# Patient Record
Sex: Female | Born: 1985
Health system: Southern US, Community
[De-identification: ages and names within clinical notes are randomized; demographics above are authoritative.]

## PROBLEM LIST (undated history)

## (undated) ENCOUNTER — Inpatient Hospital Stay (HOSPITAL_COMMUNITY): Payer: Self-pay

## (undated) ENCOUNTER — Ambulatory Visit (HOSPITAL_COMMUNITY): Discharge status: Absent without leave | Payer: No Typology Code available for payment source

## (undated) DIAGNOSIS — F419 Anxiety disorder, unspecified: Secondary | ICD-10-CM

## (undated) DIAGNOSIS — D649 Anemia, unspecified: Secondary | ICD-10-CM

## (undated) DIAGNOSIS — A549 Gonococcal infection, unspecified: Secondary | ICD-10-CM

## (undated) DIAGNOSIS — F32A Depression, unspecified: Secondary | ICD-10-CM

## (undated) DIAGNOSIS — F329 Major depressive disorder, single episode, unspecified: Secondary | ICD-10-CM

## (undated) DIAGNOSIS — J40 Bronchitis, not specified as acute or chronic: Secondary | ICD-10-CM

## (undated) DIAGNOSIS — Z87440 Personal history of urinary (tract) infections: Secondary | ICD-10-CM

## (undated) DIAGNOSIS — E669 Obesity, unspecified: Secondary | ICD-10-CM

## (undated) DIAGNOSIS — W3400XA Accidental discharge from unspecified firearms or gun, initial encounter: Secondary | ICD-10-CM

## (undated) HISTORY — PX: BLADDER REPAIR: SHX76

---
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
## 2007-08-18 ENCOUNTER — Emergency Department (HOSPITAL_COMMUNITY): Admission: EM | Admit: 2007-08-18 | Discharge: 2007-08-18 | Payer: Self-pay | Admitting: Emergency Medicine

## 2007-09-19 ENCOUNTER — Emergency Department (HOSPITAL_COMMUNITY): Admission: EM | Admit: 2007-09-19 | Discharge: 2007-09-20 | Payer: Self-pay | Admitting: Emergency Medicine

## 2007-10-28 ENCOUNTER — Emergency Department (HOSPITAL_COMMUNITY): Admission: EM | Admit: 2007-10-28 | Discharge: 2007-10-28 | Payer: Self-pay | Admitting: Emergency Medicine

## 2008-02-19 DIAGNOSIS — Y249XXA Unspecified firearm discharge, undetermined intent, initial encounter: Secondary | ICD-10-CM

## 2008-02-19 DIAGNOSIS — W3400XA Accidental discharge from unspecified firearms or gun, initial encounter: Secondary | ICD-10-CM

## 2008-02-19 HISTORY — PX: BLADDER REPAIR: SHX76

## 2008-02-19 HISTORY — DX: Unspecified firearm discharge, undetermined intent, initial encounter: Y24.9XXA

## 2008-02-19 HISTORY — DX: Accidental discharge from unspecified firearms or gun, initial encounter: W34.00XA

## 2009-03-27 ENCOUNTER — Ambulatory Visit: Payer: Self-pay | Admitting: Physician Assistant

## 2009-03-27 ENCOUNTER — Inpatient Hospital Stay (HOSPITAL_COMMUNITY): Admission: AD | Admit: 2009-03-27 | Discharge: 2009-03-27 | Payer: Self-pay | Admitting: Obstetrics and Gynecology

## 2009-11-28 ENCOUNTER — Emergency Department (HOSPITAL_COMMUNITY): Admission: EM | Admit: 2009-11-28 | Discharge: 2009-11-28 | Payer: Self-pay | Admitting: Emergency Medicine

## 2009-12-31 ENCOUNTER — Emergency Department (HOSPITAL_COMMUNITY): Admission: EM | Admit: 2009-12-31 | Discharge: 2009-12-31 | Payer: Self-pay | Admitting: Family Medicine

## 2010-05-10 LAB — CBC
HCT: 37.5 % (ref 36.0–46.0)
Hemoglobin: 12.6 g/dL (ref 12.0–15.0)
MCHC: 33.5 g/dL (ref 30.0–36.0)
RDW: 12.8 % (ref 11.5–15.5)

## 2010-05-10 LAB — URINALYSIS, ROUTINE W REFLEX MICROSCOPIC
Ketones, ur: NEGATIVE mg/dL
Nitrite: NEGATIVE
Protein, ur: NEGATIVE mg/dL
Specific Gravity, Urine: 1.01 (ref 1.005–1.030)
pH: 7 (ref 5.0–8.0)

## 2010-05-10 LAB — WET PREP, GENITAL: Yeast Wet Prep HPF POC: NONE SEEN

## 2010-05-10 LAB — URINE MICROSCOPIC-ADD ON

## 2010-05-10 LAB — URINE CULTURE

## 2010-05-10 LAB — POCT PREGNANCY, URINE: Preg Test, Ur: POSITIVE

## 2010-06-22 ENCOUNTER — Inpatient Hospital Stay (HOSPITAL_COMMUNITY)
Admission: AD | Admit: 2010-06-22 | Discharge: 2010-06-22 | Disposition: A | Discharge status: Home / Self Care | Payer: Self-pay | Source: Ambulatory Visit | Attending: Obstetrics and Gynecology | Admitting: Obstetrics and Gynecology

## 2010-06-22 DIAGNOSIS — N949 Unspecified condition associated with female genital organs and menstrual cycle: Secondary | ICD-10-CM

## 2010-06-22 DIAGNOSIS — R109 Unspecified abdominal pain: Secondary | ICD-10-CM

## 2010-06-22 DIAGNOSIS — N760 Acute vaginitis: Secondary | ICD-10-CM

## 2010-06-22 DIAGNOSIS — A499 Bacterial infection, unspecified: Secondary | ICD-10-CM | POA: Insufficient documentation

## 2010-06-22 DIAGNOSIS — N938 Other specified abnormal uterine and vaginal bleeding: Secondary | ICD-10-CM

## 2010-06-22 DIAGNOSIS — B9689 Other specified bacterial agents as the cause of diseases classified elsewhere: Secondary | ICD-10-CM | POA: Insufficient documentation

## 2010-06-22 LAB — DIFFERENTIAL
Basophils Relative: 1 % (ref 0–1)
Lymphs Abs: 2 10*3/uL (ref 0.7–4.0)
Monocytes Relative: 7 % (ref 3–12)
Neutro Abs: 3.5 10*3/uL (ref 1.7–7.7)

## 2010-06-22 LAB — CBC
HCT: 38.5 % (ref 36.0–46.0)
Hemoglobin: 13.1 g/dL (ref 12.0–15.0)
RBC: 4.32 MIL/uL (ref 3.87–5.11)
RDW: 12.1 % (ref 11.5–15.5)
WBC: 6.1 10*3/uL (ref 4.0–10.5)

## 2010-06-22 LAB — URINALYSIS, ROUTINE W REFLEX MICROSCOPIC
Bilirubin Urine: NEGATIVE
Glucose, UA: NEGATIVE mg/dL
Ketones, ur: NEGATIVE mg/dL
Nitrite: NEGATIVE
Protein, ur: NEGATIVE mg/dL
Urobilinogen, UA: 0.2 mg/dL (ref 0.0–1.0)

## 2010-06-22 LAB — WET PREP, GENITAL

## 2010-06-23 LAB — GC/CHLAMYDIA PROBE AMP, GENITAL: Chlamydia, DNA Probe: NEGATIVE

## 2010-11-15 LAB — URINALYSIS, ROUTINE W REFLEX MICROSCOPIC
Glucose, UA: NEGATIVE
Hgb urine dipstick: NEGATIVE
Protein, ur: NEGATIVE
Specific Gravity, Urine: 1.035 — ABNORMAL HIGH
pH: 6

## 2010-11-15 LAB — RPR: RPR Ser Ql: NONREACTIVE

## 2010-11-15 LAB — WET PREP, GENITAL
Trich, Wet Prep: NONE SEEN
Yeast Wet Prep HPF POC: NONE SEEN

## 2010-11-15 LAB — POCT PREGNANCY, URINE
Operator id: 22937
Preg Test, Ur: NEGATIVE

## 2010-11-16 LAB — URINALYSIS, ROUTINE W REFLEX MICROSCOPIC
Bilirubin Urine: NEGATIVE
Glucose, UA: NEGATIVE
Specific Gravity, Urine: 1.021
Urobilinogen, UA: 1
pH: 5.5

## 2010-11-16 LAB — URINE MICROSCOPIC-ADD ON

## 2010-11-21 LAB — URINALYSIS, ROUTINE W REFLEX MICROSCOPIC
Glucose, UA: NEGATIVE
Specific Gravity, Urine: 1.024

## 2010-11-21 LAB — URINE MICROSCOPIC-ADD ON

## 2010-11-21 LAB — POCT PREGNANCY, URINE: Preg Test, Ur: NEGATIVE

## 2010-12-31 ENCOUNTER — Inpatient Hospital Stay (HOSPITAL_COMMUNITY): Payer: Self-pay

## 2010-12-31 ENCOUNTER — Inpatient Hospital Stay (HOSPITAL_COMMUNITY)
Admission: AD | Admit: 2010-12-31 | Discharge: 2010-12-31 | Disposition: A | Discharge status: Home / Self Care | Payer: Self-pay | Source: Ambulatory Visit | Attending: Obstetrics & Gynecology | Admitting: Obstetrics & Gynecology

## 2010-12-31 ENCOUNTER — Encounter (HOSPITAL_COMMUNITY): Payer: Self-pay | Admitting: *Deleted

## 2010-12-31 DIAGNOSIS — O209 Hemorrhage in early pregnancy, unspecified: Secondary | ICD-10-CM | POA: Insufficient documentation

## 2010-12-31 DIAGNOSIS — R1032 Left lower quadrant pain: Secondary | ICD-10-CM | POA: Insufficient documentation

## 2010-12-31 HISTORY — DX: Accidental discharge from unspecified firearms or gun, initial encounter: W34.00XA

## 2010-12-31 HISTORY — DX: Gonococcal infection, unspecified: A54.9

## 2010-12-31 LAB — WET PREP, GENITAL: Yeast Wet Prep HPF POC: NONE SEEN

## 2010-12-31 LAB — URINALYSIS, ROUTINE W REFLEX MICROSCOPIC
Glucose, UA: NEGATIVE mg/dL
Leukocytes, UA: NEGATIVE
pH: 6 (ref 5.0–8.0)

## 2010-12-31 LAB — CBC
HCT: 39.1 % (ref 36.0–46.0)
Hemoglobin: 13.7 g/dL (ref 12.0–15.0)
MCHC: 35 g/dL (ref 30.0–36.0)
MCV: 88.5 fL (ref 78.0–100.0)
RDW: 12.3 % (ref 11.5–15.5)
WBC: 7.5 10*3/uL (ref 4.0–10.5)

## 2010-12-31 NOTE — ED Provider Notes (Signed)
History   Pt presents today c/o vag spotting "on and off." She states she has noticed 2-3 episodes of spotting over the past week. She states she has not seen anymore since Saturday. Her last episode of intercourse was about 1 month ago. She denies vag irritation but does report a vag dc. She states she was recently given a Rx for Flagyl but she did not get the medication filled because she does not like to take pills. She also c/o occ LLQ pain. She denies fever or any other sx at this time.  Chief Complaint  Patient presents with  . Vaginal Bleeding   HPI  OB History    Grav Para Term Preterm Abortions TAB SAB Ect Mult Living                  No past medical history on file.  No past surgical history on file.  No family history on file.  History  Substance Use Topics  . Smoking status: Not on file  . Smokeless tobacco: Not on file  . Alcohol Use: Not on file    Allergies:  Allergies  Allergen Reactions  . Amoxicillin Rash    Prescriptions prior to admission  Medication Sig Dispense Refill  . diphenhydramine-acetaminophen (TYLENOL PM) 25-500 MG TABS Take 1 tablet by mouth at bedtime as needed. Patient took this medication for pain and sleep.         Review of Systems  Constitutional: Negative for fever.  Cardiovascular: Negative for chest pain.  Gastrointestinal: Positive for abdominal pain. Negative for nausea, vomiting, diarrhea and constipation.  Genitourinary: Negative for dysuria, urgency, frequency and hematuria.  Neurological: Negative for dizziness and headaches.  Psychiatric/Behavioral: Negative for depression and suicidal ideas.   Physical Exam   Blood pressure 109/72, pulse 104, temperature 98.8 F (37.1 C), resp. rate 18, height 5\' 4"  (1.626 m), weight 160 lb (72.576 kg), last menstrual period 11/27/2010.  Physical Exam  Nursing note and vitals reviewed. Constitutional: She is oriented to person, place, and time. She appears well-developed and  well-nourished. No distress.  HENT:  Head: Normocephalic and atraumatic.  Eyes: EOM are normal. Pupils are equal, round, and reactive to light.  GI: Soft. She exhibits no distension. There is no tenderness. There is no rebound and no guarding.  Genitourinary: No bleeding around the vagina. Vaginal discharge found.       Creamy, white vag dc present. No vag bleeding noted. Uterus NL size and shape. No adnexal masses. Pt nontender on exam.  Neurological: She is alert and oriented to person, place, and time.  Skin: Skin is warm and dry. She is not diaphoretic.  Psychiatric: She has a normal mood and affect. Her behavior is normal. Judgment and thought content normal.    MAU Course  Procedures  Wet prep and GC/Chlamydia cultures done.  Results for orders placed during the hospital encounter of 12/31/10 (from the past 24 hour(s))  URINALYSIS, ROUTINE W REFLEX MICROSCOPIC     Status: Abnormal   Collection Time   12/31/10  2:45 PM      Component Value Range   Color, Urine YELLOW  YELLOW    Appearance HAZY (*) CLEAR    Specific Gravity, Urine 1.015  1.005 - 1.030    pH 6.0  5.0 - 8.0    Glucose, UA NEGATIVE  NEGATIVE (mg/dL)   Hgb urine dipstick NEGATIVE  NEGATIVE    Bilirubin Urine NEGATIVE  NEGATIVE    Ketones, ur NEGATIVE  NEGATIVE (  mg/dL)   Protein, ur NEGATIVE  NEGATIVE (mg/dL)   Urobilinogen, UA 1.0  0.0 - 1.0 (mg/dL)   Nitrite NEGATIVE  NEGATIVE    Leukocytes, UA NEGATIVE  NEGATIVE   POCT PREGNANCY, URINE     Status: Normal   Collection Time   12/31/10  2:57 PM      Component Value Range   Preg Test, Ur POSITIVE    WET PREP, GENITAL     Status: Abnormal   Collection Time   12/31/10  3:44 PM      Component Value Range   Yeast, Wet Prep NONE SEEN  NONE SEEN    Trich, Wet Prep NONE SEEN  NONE SEEN    Clue Cells, Wet Prep MANY (*) NONE SEEN    WBC, Wet Prep HPF POC FEW (*) NONE SEEN   CBC     Status: Normal   Collection Time   12/31/10  4:08 PM      Component Value Range     WBC 7.5  4.0 - 10.5 (K/uL)   RBC 4.42  3.87 - 5.11 (MIL/uL)   Hemoglobin 13.7  12.0 - 15.0 (g/dL)   HCT 16.1  09.6 - 04.5 (%)   MCV 88.5  78.0 - 100.0 (fL)   MCH 31.0  26.0 - 34.0 (pg)   MCHC 35.0  30.0 - 36.0 (g/dL)   RDW 40.9  81.1 - 91.4 (%)   Platelets 254  150 - 400 (K/uL)  HCG, QUANTITATIVE, PREGNANCY     Status: Abnormal   Collection Time   12/31/10  4:08 PM      Component Value Range   hCG, Beta Chain, Quant, S 562 (*) <5 (mIU/mL)   US shows no IUP or adnexal masses. Assessment and Plan  Bleeding in preg: discussed with pt at length. She will f/u in 2 days for repeat B-quant. Discussed diet, activity, risks, and precautions. Discussed signs and sx of ectopic preg.  Clinton Gallant. Tekla Malachowski III, DrHSc, MPAS, PA-C  12/31/2010, 3:43 PM   Henrietta Hoover, PA 12/31/10 1734

## 2010-12-31 NOTE — Progress Notes (Signed)
Off and on spotting, Thursday and Friday, none by the AM, light pink in color, Friday thought bleeding was beginning of period

## 2011-01-02 ENCOUNTER — Inpatient Hospital Stay (HOSPITAL_COMMUNITY)
Admission: AD | Admit: 2011-01-02 | Discharge: 2011-01-02 | Disposition: A | Discharge status: Home / Self Care | Payer: Self-pay | Source: Ambulatory Visit | Attending: Obstetrics & Gynecology | Admitting: Obstetrics & Gynecology

## 2011-01-02 ENCOUNTER — Encounter (HOSPITAL_COMMUNITY): Payer: Self-pay

## 2011-01-02 DIAGNOSIS — O99891 Other specified diseases and conditions complicating pregnancy: Secondary | ICD-10-CM | POA: Insufficient documentation

## 2011-01-02 DIAGNOSIS — O3680X0 Pregnancy with inconclusive fetal viability, not applicable or unspecified: Secondary | ICD-10-CM

## 2011-01-02 NOTE — Progress Notes (Signed)
Patient to MAU for repeat BHCG. Pt denies any pain or bleeding.

## 2011-01-02 NOTE — ED Provider Notes (Signed)
Karen Jordan is a 25 y.o. female @ [redacted]w[redacted]d gestation here for follow up Bhcg. She was evaluated 2 days ago and the Bhcg was 562. The ultrasound at that time showed no IUGS.  Today it has increased to 1,584. The patient denies cramping or bleeding. She will return in one week for follow up ultrasound. She will return sooner for any problems.   Results for orders placed during the hospital encounter of 01/02/11 (from the past 24 hour(s))  HCG, QUANTITATIVE, PREGNANCY     Status: Abnormal   Collection Time   01/02/11  1:10 PM      Component Value Range   hCG, Beta Chain, Quant, S 1584 (*) <5 (mIU/mL)    Kerrie Buffalo, NP 01/02/11 1358

## 2011-01-03 NOTE — ED Provider Notes (Signed)
Agree with above note.  Karen Que H. 01/03/2011 12:57 PM  

## 2011-01-04 NOTE — ED Provider Notes (Signed)
Attestation of Attending Supervision of Advanced Practitioner: Evaluation and management procedures were performed by the PA/NP/CNM/OB Fellow under my supervision/collaboration. Chart reviewed, and agree with management and plan.  Jaynie Collins, M.D. 01/04/2011 2:32 PM

## 2011-01-08 ENCOUNTER — Inpatient Hospital Stay (HOSPITAL_COMMUNITY)
Admission: AD | Admit: 2011-01-08 | Discharge: 2011-01-08 | Disposition: A | Discharge status: Home / Self Care | Payer: Self-pay | Source: Ambulatory Visit | Attending: Obstetrics & Gynecology | Admitting: Obstetrics & Gynecology

## 2011-01-08 ENCOUNTER — Ambulatory Visit (HOSPITAL_COMMUNITY)
Admit: 2011-01-08 | Discharge: 2011-01-08 | Disposition: A | Discharge status: Home / Self Care | Payer: Self-pay | Attending: Obstetrics & Gynecology | Admitting: Obstetrics & Gynecology

## 2011-01-08 ENCOUNTER — Encounter (HOSPITAL_COMMUNITY): Payer: Self-pay | Admitting: Physician Assistant

## 2011-01-08 DIAGNOSIS — Z1389 Encounter for screening for other disorder: Secondary | ICD-10-CM

## 2011-01-08 DIAGNOSIS — O99891 Other specified diseases and conditions complicating pregnancy: Secondary | ICD-10-CM | POA: Insufficient documentation

## 2011-01-08 DIAGNOSIS — Z3490 Encounter for supervision of normal pregnancy, unspecified, unspecified trimester: Secondary | ICD-10-CM

## 2011-01-08 DIAGNOSIS — Z3689 Encounter for other specified antenatal screening: Secondary | ICD-10-CM | POA: Insufficient documentation

## 2011-01-08 MED ORDER — PRENATAL RX 60-1 MG PO TABS: 1.0000 | ORAL_TABLET | Freq: Every day | ORAL | Status: DC

## 2011-01-08 NOTE — ED Provider Notes (Signed)
History   Chief Complaint:  No chief complaint on file.   Karen Jordan is  25 y.o. G2P0010 Patient's last menstrual period was 11/27/2010.Marland Kitchen Patient is here for follow up US and ongoing surveillance of pregnancy status.   She is [redacted]w[redacted]d weeks gestation  by LMP.    Since her last visit, the patient is without new complaint.   The patient reports bleeding as  none now.  Pt presents for review of results after Ultrasound  General ROS:  negative  Her previous Quantitative HCG values are:    Physical Exam   Last menstrual period 11/27/2010.  Focused Gynecological Exam: examination not indicated Imaging: [redacted]w[redacted]d IUP with yolk sac  Assessment: [redacted]w[redacted]d weeks gestation   Plan: Discharged from MAU care Referral to [facility] providers Preg Verification letter and PNV given  Karen Jordan E. 01/08/2011, 4:09 PM

## 2011-01-15 ENCOUNTER — Encounter (HOSPITAL_COMMUNITY): Payer: Self-pay | Admitting: *Deleted

## 2011-01-15 ENCOUNTER — Inpatient Hospital Stay (HOSPITAL_COMMUNITY)
Admission: AD | Admit: 2011-01-15 | Discharge: 2011-01-15 | Disposition: A | Discharge status: Home / Self Care | Payer: Self-pay | Source: Ambulatory Visit | Attending: Obstetrics & Gynecology | Admitting: Obstetrics & Gynecology

## 2011-01-15 DIAGNOSIS — O211 Hyperemesis gravidarum with metabolic disturbance: Secondary | ICD-10-CM | POA: Insufficient documentation

## 2011-01-15 DIAGNOSIS — E860 Dehydration: Secondary | ICD-10-CM | POA: Insufficient documentation

## 2011-01-15 DIAGNOSIS — R112 Nausea with vomiting, unspecified: Secondary | ICD-10-CM

## 2011-01-15 LAB — CBC
HCT: 37.6 % (ref 36.0–46.0)
MCH: 31.1 pg (ref 26.0–34.0)
MCV: 88.1 fL (ref 78.0–100.0)
Platelets: 256 10*3/uL (ref 150–400)
RBC: 4.27 MIL/uL (ref 3.87–5.11)
RDW: 12.1 % (ref 11.5–15.5)

## 2011-01-15 LAB — COMPREHENSIVE METABOLIC PANEL
AST: 24 U/L (ref 0–37)
BUN: 8 mg/dL (ref 6–23)
CO2: 18 mEq/L — ABNORMAL LOW (ref 19–32)
Calcium: 10.2 mg/dL (ref 8.4–10.5)
Creatinine, Ser: 0.65 mg/dL (ref 0.50–1.10)
GFR calc non Af Amer: >90 mL/min (ref >90)

## 2011-01-15 LAB — URINALYSIS, ROUTINE W REFLEX MICROSCOPIC
Glucose, UA: NEGATIVE mg/dL
Ketones, ur: >80 mg/dL — AB
Specific Gravity, Urine: >1.03 — ABNORMAL HIGH (ref 1.005–1.030)
pH: 6.5 (ref 5.0–8.0)

## 2011-01-15 LAB — URINE MICROSCOPIC-ADD ON

## 2011-01-15 MED ORDER — PROMETHAZINE HCL 25 MG/ML IJ SOLN
25.0000 mg | Freq: Once | INTRAMUSCULAR | Status: AC
  Administered 2011-01-15: 25 mg via INTRAVENOUS
  Filled 2011-01-15: qty 1

## 2011-01-15 MED ORDER — LACTATED RINGERS IV BOLUS (SEPSIS)
1000.0000 mL | Freq: Once | INTRAVENOUS | Status: AC
  Administered 2011-01-15: 1000 mL via INTRAVENOUS

## 2011-01-15 MED ORDER — PROMETHAZINE HCL 25 MG PO TABS: 25.0000 mg | ORAL_TABLET | Freq: Four times a day (QID) | ORAL | Status: DC | PRN

## 2011-01-15 NOTE — Progress Notes (Signed)
E. Rice, PA at bedside.  Assessment done and poc discussed with pt.   

## 2011-01-15 NOTE — Progress Notes (Signed)
Really haven't been able to eat since Thanksgiving.  Nothing in past 2 days, every time she eats she throws up.
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
## 2011-01-15 NOTE — Progress Notes (Signed)
Jenean Lindau, PA at bedside.  poc discussed with pt.

## 2011-01-18 LAB — URINE CULTURE: Culture  Setup Time: 201211280510

## 2011-01-20 ENCOUNTER — Telehealth: Payer: Self-pay | Admitting: Obstetrics and Gynecology

## 2011-01-20 MED ORDER — CEPHALEXIN 500 MG PO CAPS: 500.0000 mg | ORAL_CAPSULE | Freq: Two times a day (BID) | ORAL | Status: AC

## 2011-01-21 NOTE — Telephone Encounter (Signed)
Certified letter mailed with Rx written by Eveline Keto.

## 2011-02-21 NOTE — Telephone Encounter (Signed)
Certified letter returned "unclaimed, unable to forward".  

## 2011-06-25 ENCOUNTER — Emergency Department (HOSPITAL_COMMUNITY)
Admission: EM | Admit: 2011-06-25 | Discharge: 2011-06-25 | Disposition: A | Discharge status: Home / Self Care | Payer: Self-pay | Attending: Emergency Medicine | Admitting: Emergency Medicine

## 2011-06-25 ENCOUNTER — Encounter (HOSPITAL_COMMUNITY): Payer: Self-pay | Admitting: *Deleted

## 2011-06-25 ENCOUNTER — Emergency Department (HOSPITAL_COMMUNITY): Payer: Self-pay

## 2011-06-25 DIAGNOSIS — R197 Diarrhea, unspecified: Secondary | ICD-10-CM | POA: Insufficient documentation

## 2011-06-25 DIAGNOSIS — J3489 Other specified disorders of nose and nasal sinuses: Secondary | ICD-10-CM | POA: Insufficient documentation

## 2011-06-25 DIAGNOSIS — R05 Cough: Secondary | ICD-10-CM | POA: Insufficient documentation

## 2011-06-25 DIAGNOSIS — J40 Bronchitis, not specified as acute or chronic: Secondary | ICD-10-CM | POA: Insufficient documentation

## 2011-06-25 DIAGNOSIS — J45909 Unspecified asthma, uncomplicated: Secondary | ICD-10-CM | POA: Insufficient documentation

## 2011-06-25 DIAGNOSIS — R6883 Chills (without fever): Secondary | ICD-10-CM | POA: Insufficient documentation

## 2011-06-25 DIAGNOSIS — R61 Generalized hyperhidrosis: Secondary | ICD-10-CM | POA: Insufficient documentation

## 2011-06-25 DIAGNOSIS — R059 Cough, unspecified: Secondary | ICD-10-CM | POA: Insufficient documentation

## 2011-06-25 DIAGNOSIS — R1110 Vomiting, unspecified: Secondary | ICD-10-CM | POA: Insufficient documentation

## 2011-06-25 DIAGNOSIS — R079 Chest pain, unspecified: Secondary | ICD-10-CM | POA: Insufficient documentation

## 2011-06-25 MED ORDER — ALBUTEROL SULFATE (5 MG/ML) 0.5% IN NEBU
2.5000 mg | INHALATION_SOLUTION | Freq: Once | RESPIRATORY_TRACT | Status: AC
  Administered 2011-06-25: 2.5 mg via RESPIRATORY_TRACT
  Filled 2011-06-25: qty 0.5

## 2011-06-25 MED ORDER — ALBUTEROL SULFATE HFA 108 (90 BASE) MCG/ACT IN AERS
2.0000 | INHALATION_SPRAY | RESPIRATORY_TRACT | Status: DC | PRN
  Administered 2011-06-25: 2 via RESPIRATORY_TRACT
  Filled 2011-06-25: qty 6.7

## 2011-06-25 MED ORDER — AZITHROMYCIN 250 MG PO TABS
500.0000 mg | ORAL_TABLET | Freq: Once | ORAL | Status: AC
  Administered 2011-06-25: 500 mg via ORAL
  Filled 2011-06-25: qty 2

## 2011-06-25 MED ORDER — IPRATROPIUM BROMIDE 0.02 % IN SOLN
0.5000 mg | Freq: Once | RESPIRATORY_TRACT | Status: AC
  Administered 2011-06-25: 0.5 mg via RESPIRATORY_TRACT
  Filled 2011-06-25: qty 2.5

## 2011-06-25 MED ORDER — AZITHROMYCIN 250 MG PO TABS: 250.0000 mg | ORAL_TABLET | Freq: Every day | ORAL | Status: AC

## 2011-06-25 NOTE — ED Notes (Signed)
Pt is here with chest pain and back pain for 3 days and it hurts to breath.  Sats wnl.    Pt sounds congested.  Pt sts that she vomited mucous and orange juice

## 2011-06-25 NOTE — Discharge Instructions (Signed)
Bronchitis Bronchitis is the body's way of reacting to injury and/or infection (inflammation) of the bronchi. Bronchi are the air tubes that extend from the windpipe into the lungs. If the inflammation becomes severe, it may cause shortness of breath. CAUSES  Inflammation may be caused by:  A virus.   Germs (bacteria).   Dust.   Allergens.   Pollutants and many other irritants.  The cells lining the bronchial tree are covered with tiny hairs (cilia). These constantly beat upward, away from the lungs, toward the mouth. This keeps the lungs free of pollutants. When these cells become too irritated and are unable to do their job, mucus begins to develop. This causes the characteristic cough of bronchitis. The cough clears the lungs when the cilia are unable to do their job. Without either of these protective mechanisms, the mucus would settle in the lungs. Then you would develop pneumonia. Smoking is a common cause of bronchitis and can contribute to pneumonia. Stopping this habit is the single most important thing you can do to help yourself. TREATMENT   Your caregiver may prescribe an antibiotic if the cough is caused by bacteria. Also, medicines that open up your airways make it easier to breathe. Your caregiver may also recommend or prescribe an expectorant. It will loosen the mucus to be coughed up. Only take over-the-counter or prescription medicines for pain, discomfort, or fever as directed by your caregiver.   Removing whatever causes the problem (smoking, for example) is critical to preventing the problem from getting worse.   Cough suppressants may be prescribed for relief of cough symptoms.   Inhaled medicines may be prescribed to help with symptoms now and to help prevent problems from returning.   For those with recurrent (chronic) bronchitis, there may be a need for steroid medicines.  SEEK IMMEDIATE MEDICAL CARE IF:   During treatment, you develop more pus-like mucus  (purulent sputum).   You have a fever.   Your baby is older than 3 months with a rectal temperature of 102 F (38.9 C) or higher.   Your baby is 66 months old or younger with a rectal temperature of 100.4 F (38 C) or higher.   You become progressively more ill.   You have increased difficulty breathing, wheezing, or shortness of breath.  It is necessary to seek immediate medical care if you are elderly or sick from any other disease. MAKE SURE YOU:   Understand these instructions.   Will watch your condition.   Will get help right away if you are not doing well or get worse.  Document Released: 02/04/2005 Document Revised: 01/24/2011 Document Reviewed: 12/15/2007 [facility] Patient Information 2012 [place].  Azithromycin tablets What is this medicine? AZITHROMYCIN (az ith roe MYE sin) is a macrolide antibiotic. It is used to treat or prevent certain kinds of bacterial infections. It will not work for colds, flu, or other viral infections. This medicine may be used for other purposes; ask your health care provider or pharmacist if you have questions. What should I tell my health care provider before I take this medicine? They need to know if you have any of these conditions: -kidney disease -liver disease -irregular heartbeat or heart disease -an unusual or allergic reaction to azithromycin, erythromycin, other macrolide antibiotics, foods, dyes, or preservatives -pregnant or trying to get pregnant -breast-feeding How should I use this medicine? Take this medicine by mouth with a full glass of water. Follow the directions on the prescription label. The tablets can be  taken with food or on an empty stomach. If the medicine upsets your stomach, take it with food. Take your medicine at regular intervals. Do not take your medicine more often than directed. Take all of your medicine as directed even if you think your are better. Do not skip doses or stop your medicine early. Talk  to your pediatrician regarding the use of this medicine in children. Special care may be needed. Overdosage: If you think you have taken too much of this medicine contact a poison control center or emergency room at once. NOTE: This medicine is only for you. Do not share this medicine with others. What if I miss a dose? If you miss a dose, take it as soon as you can. If it is almost time for your next dose, take only that dose. Do not take double or extra doses. What may interact with this medicine? Do not take this medicine with any of the following medications: -lincomycin This medicine may also interact with the following medications: -amiodarone -antacids -cyclosporine -digoxin -dihydroergotamine or ergotamine -magnesium -nelfinavir -phenytoin -warfarin This list may not describe all possible interactions. Give your health care provider a list of all the medicines, herbs, non-prescription drugs, or dietary supplements you use. Also tell them if you smoke, drink alcohol, or use illegal drugs. Some items may interact with your medicine. What should I watch for while using this medicine? Tell your doctor or health care professional if your symptoms do not improve. Do not treat diarrhea with over the counter products. Contact your doctor if you have diarrhea that lasts more than 2 days or if it is severe and watery. This medicine can make you more sensitive to the sun. Keep out of the sun. If you cannot avoid being in the sun, wear protective clothing and use sunscreen. Do not use sun lamps or tanning beds/booths. What side effects may I notice from receiving this medicine? Side effects that you should report to your doctor or health care professional as soon as possible: -allergic reactions like skin rash, itching or hives, swelling of the face, lips, or tongue -confusion, nightmares or hallucinations -dark urine -difficulty breathing -hearing loss -irregular heartbeat or chest  pain -pain or difficulty passing urine -redness, blistering, peeling or loosening of the skin, including inside the mouth -white patches or sores in the mouth -yellowing of the eyes or skin Side effects that usually do not require medical attention (report to your doctor or health care professional if they continue or are bothersome): -diarrhea -dizziness, drowsiness -headache -stomach upset or vomiting -tooth discoloration -vaginal irritation This list may not describe all possible side effects. Call your doctor for medical advice about side effects. You may report side effects to FDA at 1-800-FDA-1088. Where should I keep my medicine? Keep out of the reach of children. Store at room temperature between 15 and 30 degrees C (59 and 86 degrees F). Throw away any unused medicine after the expiration date. NOTE: This sheet is a summary. It may not cover all possible information. If you have questions about this medicine, talk to your doctor, pharmacist, or health care provider.  2012, [employer] Standard. (04/28/2007 1:50:13 PM)  Albuterol inhalation aerosol What is this medicine? ALBUTEROL (al Gaspar Bidding) is a bronchodilator. It helps open up the airways in your lungs to make it easier to breathe. This medicine is used to treat and to prevent bronchospasm. This medicine may be used for other purposes; ask your health care provider or pharmacist if  you have questions. What should I tell my health care provider before I take this medicine? They need to know if you have any of the following conditions: -diabetes -heart disease or irregular heartbeat -high blood pressure -pheochromocytoma -seizures -thyroid disease -an unusual or allergic reaction to albuterol, levalbuterol, sulfites, other medicines, foods, dyes, or preservatives -pregnant or trying to get pregnant -breast-feeding How should I use this medicine? This medicine is for inhalation through the mouth. Follow the  directions on your prescription label. Take your medicine at regular intervals. Do not use more often than directed. Make sure that you are using your inhaler correctly. Ask you doctor or health care provider if you have any questions. Use this medicine before you use any other inhaler. Wait 5 minutes or more before between using different inhalers. Talk to your pediatrician regarding the use of this medicine in children. Special care may be needed. Overdosage: If you think you have taken too much of this medicine contact a poison control center or emergency room at once. NOTE: This medicine is only for you. Do not share this medicine with others. What if I miss a dose? If you miss a dose, use it as soon as you can. If it is almost time for your next dose, use only that dose. Do not use double or extra doses. What may interact with this medicine? -anti-infectives like chloroquine and pentamidine -caffeine -cisapride -diuretics -medicines for colds -medicines for depression or for emotional or psychotic conditions -medicines for weight loss including some herbal products -methadone -some antibiotics like clarithromycin, erythromycin, levofloxacin, and linezolid -some heart medicines -steroid hormones like dexamethasone, cortisone, hydrocortisone -theophylline -thyroid hormones This list may not describe all possible interactions. Give your health care provider a list of all the medicines, herbs, non-prescription drugs, or dietary supplements you use. Also tell them if you smoke, drink alcohol, or use illegal drugs. Some items may interact with your medicine. What should I watch for while using this medicine? Tell your doctor or health care professional if your symptoms do not improve. Do not use extra albuterol. If your asthma or bronchitis gets worse while you are using this medicine, call your doctor right away. If your mouth gets dry try chewing sugarless gum or sucking hard candy. Drink  water as directed. What side effects may I notice from receiving this medicine? Side effects that you should report to your doctor or health care professional as soon as possible: -allergic reactions like skin rash, itching or hives, swelling of the face, lips, or tongue -breathing problems -chest pain -feeling faint or lightheaded, falls -high blood pressure -irregular heartbeat -fever -muscle cramps or weakness -pain, tingling, numbness in the hands or feet -vomiting Side effects that usually do not require medical attention (report to your doctor or health care professional if they continue or are bothersome): -cough -difficulty sleeping -headache -nervousness or trembling -stomach upset -stuffy or runny nose -throat irritation -unusual taste This list may not describe all possible side effects. Call your doctor for medical advice about side effects. You may report side effects to FDA at 1-800-FDA-1088. Where should I keep my medicine? Keep out of the reach of children. Store at room temperature between 15 and 30 degrees C (59 and 86 degrees F). The contents are under pressure and may burst when exposed to heat or flame. Do not freeze. This medicine does not work as well if it is too cold. Throw away any unused medicine after the expiration date. Inhalers need to be  thrown away after the labeled number of puffs have been used or by the expiration date; whichever comes first. Ventolin HFA should be thrown away 12 months after removing from foil pouch. Check the instructions that come with your medicine. NOTE: This sheet is a summary. It may not cover all possible information. If you have questions about this medicine, talk to your doctor, pharmacist, or health care provider.  2012, [employer] Standard. (06/22/2010 11:00:52 AM)  Smoking Hazards Smoking cigarettes is extremely bad for your health. Tobacco smoke has over 200 known poisons in it. There are over 60 chemicals in tobacco  smoke that cause cancer. Some of the chemicals found in cigarette smoke include:   Cyanide.   Benzene.   Formaldehyde.   Methanol (wood alcohol).   Acetylene (fuel used in welding torches).   Ammonia.  Cigarette smoke also contains the poisonous gases nitrogen oxide and carbon monoxide.  Cigarette smokers have an increased risk of many serious medical problems, including:  Lung cancer.   Lung disease (such as pneumonia, bronchitis, and emphysema).   Heart attack and chest pain due to the heart not getting enough oxygen (angina).   Heart disease and peripheral blood vessel disease.   Hypertension.   Stroke.   Oral cancer (cancer of the lip, mouth, or voice box).   Bladder cancer.   Pancreatic cancer.   Cervical cancer.   Pregnancy complications, including premature birth.   Low birthweight babies.   Early menopause.   Lower estrogen level for women.   Infertility.   Facial wrinkles.   Blindness.   Increased risk of broken bones (fractures).   Senile dementia.   Stillbirths and smaller newborn babies, birth defects, and genetic damage to sperm.   Stomach ulcers and internal bleeding.  Children of smokers have an increased risk of the following, because of secondhand smoke exposure:   Sudden infant death syndrome (SIDS).   Respiratory infections.   Lung cancer.   Heart disease.   Ear infections.  Smoking causes approximately:  90% of all lung cancer deaths in men.   80% of all lung cancer deaths in women.   90% of deaths from chronic obstructive lung disease.  Compared with nonsmokers, smoking increases the risk of:  Coronary heart disease by 2 to 4 times.   Stroke by 2 to 4 times.   Men developing lung cancer by 23 times.   Women developing lung cancer by 13 times.   Dying from chronic obstructive lung diseases by 12 times.  Someone who smokes 2 packs a day loses about 8 years of his or her life. Even smoking lightly shortens your  life expectancy by several years. You can greatly reduce the risk of medical problems for you and your family by stopping now. Smoking is the most preventable cause of death and disease in our society. Within days of quitting smoking, your circulation returns to normal, you decrease the risk of having a heart attack, and your lung capacity improves. There may be some increased phlegm in the first few days after quitting, and it may take months for your lungs to clear up completely. Quitting for 10 years cuts your lung cancer risk to almost that of a nonsmoker. WHY IS SMOKING ADDICTIVE?  Nicotine is the chemical agent in tobacco that is capable of causing addiction or dependence.   When you smoke and inhale, nicotine is absorbed rapidly into the bloodstream through your lungs. Nicotine absorbed through the lungs is capable of creating a powerful addiction.  Both inhaled and non-inhaled nicotine may be addictive.   Addiction studies of cigarettes and spit tobacco show that addiction to nicotine occurs mainly during the teen years, when young people begin using tobacco products.  WHAT ARE THE BENEFITS OF QUITTING?  There are many health benefits to quitting smoking.   Likelihood of developing cancer and heart disease decreases. Health improvements are seen almost immediately.   Blood pressure, pulse rate, and breathing patterns start returning to normal soon after quitting.   People who quit may see an improvement in their overall quality of life.  Some people choose to quit all at once. Other options include nicotine replacement products, such as patches, gum, and nasal sprays. Do not use these products without first checking with your caregiver. QUITTING SMOKING It is not easy to quit smoking. Nicotine is addicting, and longtime habits are hard to change. To start, you can write down all your reasons for quitting, tell your family and friends you want to quit, and ask for their help. Throw your  cigarettes away, chew gum or cinnamon sticks, keep your hands busy, and drink extra water or juice. Go for walks and practice deep breathing to relax. Think of all the money you are saving: around $1,000 a year, for the average pack-a-day smoker. Nicotine patches and gum have been shown to improve success at efforts to stop smoking. Zyban (bupropion) is an anti-depressant drug that can be prescribed to reduce nicotine withdrawal symptoms and to suppress the urge to smoke. Smoking is an addiction with both physical and psychological effects. Joining a stop-smoking support group can help you cope with the emotional issues. For more information and advice on programs to stop smoking, call your doctor, your local hospital, or these organizations:  American Lung Association - 1-800-LUNGUSA   American Cancer Society - 1-800-ACS-2345  Document Released: 03/14/2004 Document Revised: 01/24/2011 Document Reviewed: 11/16/2008 [facility] Patient Information 2012 [place], [place].
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
## 2011-07-12 ENCOUNTER — Encounter (HOSPITAL_COMMUNITY): Payer: Self-pay

## 2011-07-12 ENCOUNTER — Inpatient Hospital Stay (HOSPITAL_COMMUNITY)
Admission: AD | Admit: 2011-07-12 | Discharge: 2011-07-12 | Disposition: A | Discharge status: Home / Self Care | Payer: Self-pay | Source: Ambulatory Visit | Attending: Obstetrics & Gynecology | Admitting: Obstetrics & Gynecology

## 2011-07-12 ENCOUNTER — Inpatient Hospital Stay (HOSPITAL_COMMUNITY): Payer: Self-pay

## 2011-07-12 DIAGNOSIS — N760 Acute vaginitis: Secondary | ICD-10-CM | POA: Insufficient documentation

## 2011-07-12 DIAGNOSIS — N730 Acute parametritis and pelvic cellulitis: Secondary | ICD-10-CM | POA: Diagnosis present

## 2011-07-12 DIAGNOSIS — N739 Female pelvic inflammatory disease, unspecified: Secondary | ICD-10-CM | POA: Insufficient documentation

## 2011-07-12 DIAGNOSIS — O98919 Unspecified maternal infectious and parasitic disease complicating pregnancy, unspecified trimester: Secondary | ICD-10-CM | POA: Diagnosis present

## 2011-07-12 DIAGNOSIS — M545 Low back pain, unspecified: Secondary | ICD-10-CM | POA: Insufficient documentation

## 2011-07-12 DIAGNOSIS — R1031 Right lower quadrant pain: Secondary | ICD-10-CM | POA: Insufficient documentation

## 2011-07-12 DIAGNOSIS — A499 Bacterial infection, unspecified: Secondary | ICD-10-CM | POA: Insufficient documentation

## 2011-07-12 DIAGNOSIS — Z331 Pregnant state, incidental: Secondary | ICD-10-CM

## 2011-07-12 DIAGNOSIS — B9689 Other specified bacterial agents as the cause of diseases classified elsewhere: Secondary | ICD-10-CM | POA: Insufficient documentation

## 2011-07-12 HISTORY — DX: Acute parametritis and pelvic cellulitis: N73.0

## 2011-07-12 LAB — POCT PREGNANCY, URINE: Preg Test, Ur: POSITIVE — AB

## 2011-07-12 LAB — CBC
MCH: 30.4 pg (ref 26.0–34.0)
MCHC: 34.7 g/dL (ref 30.0–36.0)
MCV: 87.5 fL (ref 78.0–100.0)
Platelets: 274 10*3/uL (ref 150–400)

## 2011-07-12 LAB — DIFFERENTIAL
Basophils Relative: 0 % (ref 0–1)
Eosinophils Absolute: 0.2 10*3/uL (ref 0.0–0.7)
Eosinophils Relative: 2 % (ref 0–5)
Lymphs Abs: 2.5 10*3/uL (ref 0.7–4.0)
Neutrophils Relative %: 62 % (ref 43–77)

## 2011-07-12 LAB — URINALYSIS, ROUTINE W REFLEX MICROSCOPIC
Bilirubin Urine: NEGATIVE
Glucose, UA: NEGATIVE mg/dL
Hgb urine dipstick: NEGATIVE
Specific Gravity, Urine: 1.02 (ref 1.005–1.030)
Urobilinogen, UA: 0.2 mg/dL (ref 0.0–1.0)
pH: 6 (ref 5.0–8.0)

## 2011-07-12 LAB — WET PREP, GENITAL
Trich, Wet Prep: NONE SEEN
Yeast Wet Prep HPF POC: NONE SEEN

## 2011-07-12 MED ORDER — METRONIDAZOLE 500 MG PO TABS: 500.0000 mg | ORAL_TABLET | Freq: Three times a day (TID) | ORAL | Status: DC

## 2011-07-12 MED ORDER — AZITHROMYCIN 500 MG PO TABS: 1000.0000 mg | ORAL_TABLET | Freq: Once | ORAL | Status: DC

## 2011-07-12 MED ORDER — CEFTRIAXONE SODIUM 250 MG IJ SOLR
250.0000 mg | Freq: Once | INTRAMUSCULAR | Status: AC
  Administered 2011-07-12: 250 mg via INTRAMUSCULAR
  Filled 2011-07-12: qty 250

## 2011-07-12 MED ORDER — AZITHROMYCIN 250 MG PO TABS
1000.0000 mg | ORAL_TABLET | Freq: Once | ORAL | Status: AC
  Administered 2011-07-12: 1000 mg via ORAL
  Filled 2011-07-12: qty 4
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
## 2011-07-12 NOTE — MAU Note (Signed)
Having lower back pain and lower abdominal pain x 1 week sharp at times, vaginal odor, wants to be checked for STDs

## 2011-07-12 NOTE — Discharge Instructions (Signed)
Start taking a PreNatal Vitamin.  Schedule a follow in a clinic.  We have provided you a list of OB providers in the area. I also printed a pregnancy verification letter for you to be able to apply for medicaid.  You will need to take the Flagyl for three times daily for 7 days to treat your BV and 1 additional dose of azithromycin on Friday 5/31 to treat your PID.  Please see attached handouts for more information.  Prenatal Care  WHAT IS PRENATAL CARE?  Prenatal care means health care during your pregnancy, before your baby is born. Take care of yourself and your baby by:   Getting early prenatal care. If you know you are pregnant, or think you might be pregnant, call your caregiver as soon as possible. Schedule a visit for a general/prenatal examination.   Getting regular prenatal care. Follow your caregiver's schedule for blood and other necessary tests. Do not miss appointments.   Do everything you can to keep yourself and your baby healthy during your pregnancy.   Prenatal care should include evaluation of medical, dietary, educational, psychological, and social needs for the couple and the medical, surgical, and genetic history of the family of the mother and father.   Discuss with your caregiver:   Your medicines, prescription, over-the-counter, and herbal medicines.   Substance abuse, alcohol, smoking, and illegal drugs.   Domestic abuse and violence, if present.   Your immunizations.   Nutrition and diet.   Exercising.   Environment and occupational hazards, at home and at work.   History of sexually transmitted disease, both you and your partner.   Previous pregnancies.  WHY IS PRENATAL CARE SO IMPORTANT?  By seeing you regularly, your caregiver has the chance to find problems early, so that they can be treated as soon as possible. Other problems might be prevented. Many studies have shown that early and regular prenatal care is important for the health of both mothers  and their babies.  I AM THINKING ABOUT GETTING PREGNANT. HOW CAN I TAKE CARE OF MYSELF?  Taking care of yourself before you get pregnant helps you to have a healthy pregnancy. It also lowers your chances of having a baby born with a birth defect. Here are ways to take care of yourself before you get pregnant:   Eat healthy foods, exercise regularly (30 minutes per day for most days of the week is best), and get enough rest and sleep. Talk to your caregiver about what kinds of foods and exercises are best for you.   Take 400 micrograms (mcg) of folic acid (one of the B vitamins) every day. The best way to do this is to take a daily multivitamin pill that contains this amount of folic acid. Getting enough of the synthetic (manufactured) form of folic acid every day before you get pregnant and during early pregnancy can help prevent certain birth defects. Many breakfast cereals and other grain products have folic acid added to them, but only certain cereals contain 400 mcg of folic acid per serving. Check the label on your multivitamin or cereal to find the amount of folic acid in the food.   See your caregiver for a complete check up before getting pregnant. Make sure that you have had all your immunization shots, especially for rubella ([place] measles). Rubella can cause serious birth defects. Chickenpox is another illness you want to avoid during pregnancy. If you have had chickenpox and rubella in the past, you should be immune  to them.   Tell your caregiver about any prescription or non-prescription medicines (including herbal remedies) you are taking. Some medicines are not safe to take during pregnancy.   Stop smoking cigarettes, drinking alcohol, or taking illegal drugs. Ask your caregiver for help, if you need it. You can also get help with alcohol and drugs by talking with a member of your faith community, a counselor, or a trusted friend.   Discuss and treat any medical, social, or  psychological problems before getting pregnant.   Discuss any history of genetic problems in the mother, father, and their families. Do genetic testing before getting pregnant, when possible.   Discuss any physical or emotional abuse with your caregiver.   Discuss with your caregiver if you might be exposed to harmful chemicals on your job or where you live.   Discuss with your caregiver if you think your job or the hours you work may be harmful and should be changed.   The father should be involved with the decision making and with all aspects of the pregnancy, labor, and delivery.   If you have medical insurance, make sure you are covered for pregnancy.  I JUST FOUND OUT THAT I AM PREGNANT. HOW CAN I TAKE CARE OF MYSELF?  Here are ways to take care of yourself and the precious new life growing inside you:   Continue taking your multivitamin with 400 micrograms (mcg) of folic acid every day.   Get early and regular prenatal care. It does not matter if this is your first pregnancy or if you already have children. It is very important to see a caregiver during your pregnancy. Your caregiver will check at each visit to make sure that you and the baby are healthy. If there are any problems, action can be taken right away to help you and the baby.   Eat a healthy diet that includes:   Fruits.   Vegetables.   Foods low in saturated fat.   Grains.   Calcium-rich foods.   Drink 6 to 8 glasses of liquids a day.   Unless your caregiver tells you not to, try to be physically active for 30 minutes, most days of the week. If you are pressed for time, you can get your activity in through 10 minute segments, three times a day.   If you smoke, drink alcohol, or use drugs, STOP. These can cause long-term damage to your baby. Talk with your caregiver about steps to take to stop smoking. Talk with a member of your faith community, a counselor, a trusted friend, or your caregiver if you are  concerned about your alcohol or drug use.   Ask your caregiver before taking any medicine, even over-the-counter medicines. Some medicines are not safe to take during pregnancy.   Get plenty of rest and sleep.   Avoid hot tubs and saunas during pregnancy.   Do not have X-rays taken, unless absolutely necessary and with the recommendation of your caregiver. A lead shield can be placed on your abdomen, to protect the baby when X-rays are taken in other parts of the body.   Do not empty the cat litter when you are pregnant. It may contain a parasite that causes an infection called toxoplasmosis, which can cause birth defects. Also, use gloves when working in garden areas used by cats.   Do not eat uncooked or undercooked cheese, meats, or fish.   Stay away from toxic chemicals like:   Insecticides.   Solvents (some cleaners  or paint thinners).   Lead.   Mercury.   Sexual relations may continue until the end of the pregnancy, unless you have a medical problem or there is a problem with the pregnancy and your caregiver tells you not to.   Do not wear high heel shoes, especially during the second half of the pregnancy. You can lose your balance and fall.   Do not take long trips, unless absolutely necessary. Be sure to see your caregiver before going on the trip.   Do not sit in one position for more than 2 hours, when on a trip.   Take a copy of your medical records when going on a trip.   Know where there is a hospital in the city you are visiting, in case of an emergency.   Most dangerous household products will have pregnancy warnings on their labels. Ask your caregiver about products if you are unsure.   Limit or eliminate your caffeine intake from coffee, tea, sodas, medicines, and chocolate.   Many women continue working through pregnancy. Staying active might help you stay healthier. If you have a question about the safety or the hours you work at your particular job, talk  with your caregiver.   Get informed:   Read books.   Watch videos.   Go to childbirth classes for you and the father.   Talk with experienced moms.   Ask your caregiver about childbirth education classes for you and your partner. Classes can help you and your partner prepare for the birth of your baby.   Ask about a pediatrician (baby doctor) and methods and pain medicine for labor, delivery, and possible Cesarean delivery (C-section).  I AM NOT THINKING ABOUT GETTING PREGNANT RIGHT NOW, BUT HEARD THAT ALL WOMEN SHOULD TAKE FOLIC ACID EVERY DAY?  All women of childbearing age, with even a remote chance of getting pregnant, should try to make sure they get enough folic acid. Many pregnancies are not planned. Many women do not know they are actually pregnant early in their pregnancies, and certain birth defects happen in the very early part of pregnancy. Taking 400 micrograms (mcg) of folic acid every day will help prevent certain birth defects that happen in the early part of pregnancy. If a woman begins taking vitamin pills in the second or third month of pregnancy, it may be too late to prevent birth defects. Folic acid may also have other health benefits for women, besides preventing birth defects.  HOW OFTEN SHOULD I SEE MY CAREGIVER DURING PREGNANCY?  Your caregiver will give you a schedule for your prenatal visits. You will have visits more often as you get closer to the end of your pregnancy. An average pregnancy lasts about 40 weeks.  A typical schedule includes visiting your caregiver:   About once each month, during your first 6 months of pregnancy.   Every 2 weeks, during the next 2 months.   Weekly in the last month, until the delivery date.  Your caregiver will probably want to see you more often if:  You are over 35.   Your pregnancy is high risk, because you have certain health problems or problems with the pregnancy, such as:   Diabetes.   High blood pressure.   The  baby is not growing on schedule, according to the dates of the pregnancy.  Your caregiver will do special tests, to make sure you and the baby are not having any serious problems. WHAT HAPPENS DURING PRENATAL VISITS?  At your first prenatal visit, your caregiver will talk to you about you and your partner's health history and your family's health history, and will do a physical exam.   On your first visit, a physical exam will include checks of your blood pressure, height and weight, and an exam of your pelvic organs. Your caregiver will do a Pap test if you have not had one recently, and will do cultures of your cervix to make sure there is no infection.   At each visit, there will be tests of your blood, urine, blood pressure, weight, and checking the progress of the baby.   Your caregiver will be able to tell you when to expect that your baby will be born.   Each visit is also a chance for you to learn about staying healthy during pregnancy and for asking questions.   Discuss whether you will be breastfeeding.   At your later prenatal visits, your caregiver will check how you are doing and how the baby is developing. You may have a number of tests done as your pregnancy progresses.   Ultrasound exams are often used to check on the baby's growth and health.   You may have more urine and blood tests, as well as special tests, if needed. These may include amniocentesis (examine fluid in the pregnancy sac), stress tests (check how baby responds to contractions), biophysical profile (measures fetus well-being). Your caregiver will explain the tests and why they are necessary.  I AM IN MY LATE THIRTIES, AND I WANT TO HAVE A CHILD NOW. SHOULD I DO ANYTHING SPECIAL?  As you get older, there is more chance of having a medical problem (high blood pressure), pregnancy problem (preeclampsia, problems with the placenta), miscarriage, or a baby born with a birth defect. However, most women in their late  thirties and early forties have healthy babies. See your caregiver on a regular basis before you get pregnant and be sure to go for exams throughout your pregnancy. Your caregiver probably will want to do some special tests to check on you and your baby's health when you are pregnant.  Women today are often delaying having children until later in life, when they are in their thirties and forties. While many women in their thirties and forties have no difficulty getting pregnant, fertility does decline with age. For women over 40 who cannot get pregnant after 6 months of trying, it is recommended that they see their caregiver for a fertility evaluation. It is not uncommon to have trouble becoming pregnant or experience infertility (inability to become pregnant after trying for one year). If you think that you or your partner may be infertile, you can discuss this with your caregiver. He or she can recommend treatments such as drugs, surgery, or assisted reproductive technology.  Document Released: 02/07/2003 Document Revised: 01/24/2011 Document Reviewed: 01/04/2009 [facility] Patient Information 2012 [place], [place].  Pelvic Inflammatory Disease Pelvic Inflammatory Disease (PID) is an infection in some or all of your female organs. This includes the womb (uterus), ovaries, fallopian tubes and tissues in the pelvis. PID is a common cause of sudden onset (acute) lower abdominal (pelvic) pain. PID can be treated, but it is a serious infection. It may take weeks before you are completely well. In some cases, hospitalization is needed for surgery or to administer medications to kill germs (antibiotics) through your veins (intravenously). CAUSES   It may be caused by germs that are spread during sexual contact.   PID can also occur  following:   The birth of a baby.   A miscarriage.   An abortion.   Major surgery of the pelvis.   Use of an IUD.   Sexual assault.  SYMPTOMS   Abdominal or pelvic pain.    Fever.   Chills.   Abnormal vaginal discharge.  DIAGNOSIS  Your caregiver will choose some of these methods to make a diagnosis:  A physical exam and history.   Blood tests.   Cultures of the vagina and cervix.   X-rays or ultrasound.   A procedure to look inside the pelvis (laparoscopy).  TREATMENT   Use of antibiotics by mouth or intravenously.   Treatment of sexual partners when the infection is an sexually transmitted disease (STD).   Hospitalization and surgery may be needed.  RISKS AND COMPLICATIONS   PID can cause women to become unable to have children (sterile) if left untreated or if partially treated. That is why it is important to finish all medications given to you.   Sterility or future tubal (ectopic) pregnancies can occur in fully treated individuals. This is why it is so important to follow your prescribed treatment.   It can cause longstanding (chronic) pelvic pain after frequent infections.   Painful intercourse.   Pelvic abscesses.   In rare cases, surgery or a hysterectomy may be needed.   If this is a sexually transmitted infection (STI), you are also at risk for any other STD including AIDSor human papillomavirus (HPV).  HOME CARE INSTRUCTIONS   Finish all medication as prescribed. Incomplete treatment will put you at risk for sterility and tubal pregnancy.   Only take over-the-counter or prescription medicines for pain, discomfort, or fever as directed by your caregiver.   Do not have sex until treatment is completed or as directed by your caregiver. If PID is confirmed, your recent sexual contacts will need treatment.   Keep your follow-up appointments.  SEEK MEDICAL CARE IF:   You have increased or abnormal vaginal discharge.   You need prescription medication for your pain.   Your partner has an STD.   You are vomiting.   You cannot take your medications.  SEEK IMMEDIATE MEDICAL CARE IF:   You have a fever.   You develop  increased abdominal or pelvic pain.   You develop chills.   You have pain when you urinate.   You are not better after 72 hours following treatment.  Document Released: 02/04/2005 Document Revised: 01/24/2011 Document Reviewed: 10/18/2006 [facility] Patient Information 2012 [place], [place].  Bacterial Vaginosis Bacterial vaginosis (BV) is a vaginal infection where the normal balance of bacteria in the vagina is disrupted. The normal balance is then replaced by an overgrowth of certain bacteria. There are several different kinds of bacteria that can cause BV. BV is the most common vaginal infection in women of childbearing age. CAUSES   The cause of BV is not fully understood. BV develops when there is an increase or imbalance of harmful bacteria.   Some activities or behaviors can upset the normal balance of bacteria in the vagina and put women at increased risk including:   Having a new sex partner or multiple sex partners.   Douching.   Using an intrauterine device (IUD) for contraception.   It is not clear what role sexual activity plays in the development of BV. However, women that have never had sexual intercourse are rarely infected with BV.  Women do not get BV from toilet seats, bedding, swimming pools or from  touching objects around them.  SYMPTOMS   Grey vaginal discharge.   A fish-like odor with discharge, especially after sexual intercourse.   Itching or burning of the vagina and vulva.   Burning or pain with urination.   Some women have no signs or symptoms at all.  DIAGNOSIS  Your caregiver must examine the vagina for signs of BV. Your caregiver will perform lab tests and look at the sample of vaginal fluid through a microscope. They will look for bacteria and abnormal cells (clue cells), a pH test higher than 4.5, and a positive amine test all associated with BV.  RISKS AND COMPLICATIONS   Pelvic inflammatory disease (PID).   Infections following gynecology  surgery.   Developing HIV.   Developing herpes virus.  TREATMENT  Sometimes BV will clear up without treatment. However, all women with symptoms of BV should be treated to avoid complications, especially if gynecology surgery is planned. Female partners generally do not need to be treated. However, BV may spread between female sex partners so treatment is helpful in preventing a recurrence of BV.   BV may be treated with antibiotics. The antibiotics come in either pill or vaginal cream forms. Either can be used with nonpregnant or pregnant women, but the recommended dosages differ. These antibiotics are not harmful to the baby.   BV can recur after treatment. If this happens, a second round of antibiotics will often be prescribed.   Treatment is important for pregnant women. If not treated, BV can cause a premature delivery, especially for a pregnant woman who had a premature birth in the past. All pregnant women who have symptoms of BV should be checked and treated.   For chronic reoccurrence of BV, treatment with a type of prescribed gel vaginally twice a week is helpful.  HOME CARE INSTRUCTIONS   Finish all medication as directed by your caregiver.   Do not have sex until treatment is completed.   Tell your sexual partner that you have a vaginal infection. They should see their caregiver and be treated if they have problems, such as a mild rash or itching.   Practice safe sex. Use condoms. Only have 1 sex partner.  PREVENTION  Basic prevention steps can help reduce the risk of upsetting the natural balance of bacteria in the vagina and developing BV:  Do not have sexual intercourse (be abstinent).   Do not douche.   Use all of the medicine prescribed for treatment of BV, even if the signs and symptoms go away.   Tell your sex partner if you have BV. That way, they can be treated, if needed, to prevent reoccurrence.  SEEK MEDICAL CARE IF:   Your symptoms are not improving after 3  days of treatment.   You have increased discharge, pain, or fever.  MAKE SURE YOU:   Understand these instructions.   Will watch your condition.   Will get help right away if you are not doing well or get worse.  FOR MORE INFORMATION  Division of STD Prevention (DSTDP), Centers for Disease Control and Prevention: [employer] American Social Health Association (ASHA): www.ashastd.org  Document Released: 02/04/2005 Document Revised: 01/24/2011 Document Reviewed: 07/28/2008 [facility] Patient Information 2012 [place], [place].
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
## 2011-07-13 LAB — GC/CHLAMYDIA PROBE AMP, GENITAL
Chlamydia, DNA Probe: NEGATIVE
GC Probe Amp, Genital: NEGATIVE

## 2011-07-17 ENCOUNTER — Inpatient Hospital Stay (HOSPITAL_COMMUNITY)
Admission: AD | Admit: 2011-07-17 | Discharge: 2011-07-17 | Disposition: A | Discharge status: Home / Self Care | Payer: Self-pay | Source: Ambulatory Visit | Attending: Obstetrics and Gynecology | Admitting: Obstetrics and Gynecology

## 2011-07-17 ENCOUNTER — Encounter (HOSPITAL_COMMUNITY): Payer: Self-pay | Admitting: *Deleted

## 2011-07-17 DIAGNOSIS — O219 Vomiting of pregnancy, unspecified: Secondary | ICD-10-CM

## 2011-07-17 DIAGNOSIS — O211 Hyperemesis gravidarum with metabolic disturbance: Secondary | ICD-10-CM | POA: Insufficient documentation

## 2011-07-17 DIAGNOSIS — R112 Nausea with vomiting, unspecified: Secondary | ICD-10-CM

## 2011-07-17 DIAGNOSIS — E860 Dehydration: Secondary | ICD-10-CM | POA: Insufficient documentation

## 2011-07-17 DIAGNOSIS — R197 Diarrhea, unspecified: Secondary | ICD-10-CM | POA: Insufficient documentation

## 2011-07-17 LAB — URINALYSIS, ROUTINE W REFLEX MICROSCOPIC
Bilirubin Urine: NEGATIVE
Glucose, UA: NEGATIVE mg/dL
Ketones, ur: 40 mg/dL — AB
Leukocytes, UA: NEGATIVE
pH: 6 (ref 5.0–8.0)

## 2011-07-17 LAB — DIFFERENTIAL
Basophils Relative: 0 % (ref 0–1)
Lymphs Abs: 1.4 10*3/uL (ref 0.7–4.0)
Monocytes Absolute: 0.6 10*3/uL (ref 0.1–1.0)
Monocytes Relative: 7 % (ref 3–12)
Neutro Abs: 6.6 10*3/uL (ref 1.7–7.7)

## 2011-07-17 LAB — COMPREHENSIVE METABOLIC PANEL
Albumin: 4 g/dL (ref 3.5–5.2)
BUN: 7 mg/dL (ref 6–23)
CO2: 21 mEq/L (ref 19–32)
Chloride: 100 mEq/L (ref 96–112)
Creatinine, Ser: 0.62 mg/dL (ref 0.50–1.10)
GFR calc Af Amer: >90 mL/min (ref >90)
GFR calc non Af Amer: >90 mL/min (ref >90)
Glucose, Bld: 85 mg/dL (ref 70–99)
Total Bilirubin: 0.5 mg/dL (ref 0.3–1.2)

## 2011-07-17 LAB — CBC
HCT: 36.6 % (ref 36.0–46.0)
Hemoglobin: 12.6 g/dL (ref 12.0–15.0)
MCHC: 34.4 g/dL (ref 30.0–36.0)

## 2011-07-17 MED ORDER — PROMETHAZINE HCL 25 MG PO TABS: 12.5000 mg | ORAL_TABLET | Freq: Four times a day (QID) | ORAL | Status: DC | PRN

## 2011-07-17 MED ORDER — LACTATED RINGERS IV BOLUS (SEPSIS)
1000.0000 mL | Freq: Once | INTRAVENOUS | Status: AC
  Administered 2011-07-17: 1000 mL via INTRAVENOUS

## 2011-07-17 MED ORDER — PROMETHAZINE HCL 25 MG/ML IJ SOLN
12.5000 mg | Freq: Once | INTRAMUSCULAR | Status: AC
  Administered 2011-07-17: 12.5 mg via INTRAVENOUS
  Filled 2011-07-17: qty 1

## 2011-07-17 NOTE — Discharge Instructions (Signed)
Stop the Flagyl until your nausea and vomiting is better.      ________________________________________     To schedule your Maternity Eligibility Appointment, please call [phone].  When you arrive for your appointment you must bring the following items or information listed below.  Your appointment will be rescheduled if you do not have these items or are 15 minutes late. If currently receiving Medicaid, you MUST bring: 1. Medicaid Card 2. Social Security Card 3. Picture ID 4. Proof of Pregnancy 5. Verification of current address if the address on Medicaid card is incorrect "postmarked mail" If not receiving Medicaid, you MUST bring: 1. Social Security Card 2. Picture ID 3. Birth Certificate (if available) Passport or *Green Card 4. Proof of Pregnancy 5. Verification of current address "postmarked mail" for each income presented. 6. Verification of insurance coverage, if any 7. Check stubs from each employer for the previous month (if unable to present check stub  for each week, we will accept check stub for the first and last week ill the same month.) If you can't locate check stubs, you must bring a letter from the employer(s) and it must have the following information on letterhead, typed, in English: o name of company o company telephone number o how long been with the company, if less than one month o how much person earns per hour o how many hours per week work o the gross pay the person earned for the previous month If you are 24 years old or less, you do not have to bring proof of income unless you work or live with the father of the baby and at that time we will need proof of income from you and/or the father of the baby. Green Card recipients are eligible for Medicaid for Pregnant Women (MPW)

## 2011-07-17 NOTE — MAU Note (Signed)
Pt presents to mau for c/o vomiting and diarrhea that started yesterday.  Stets she has thrown up 4 times in last 24 hours.  States 2-3 BM's in last 24 hours.  Denies pain or bleeding.
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
## 2011-07-19 ENCOUNTER — Inpatient Hospital Stay (HOSPITAL_COMMUNITY)
Admission: AD | Admit: 2011-07-19 | Discharge: 2011-07-20 | Disposition: A | Discharge status: Home / Self Care | Payer: Self-pay | Source: Ambulatory Visit | Attending: Obstetrics & Gynecology | Admitting: Obstetrics & Gynecology

## 2011-07-19 ENCOUNTER — Encounter (HOSPITAL_COMMUNITY): Payer: Self-pay | Admitting: *Deleted

## 2011-07-19 DIAGNOSIS — R109 Unspecified abdominal pain: Secondary | ICD-10-CM | POA: Insufficient documentation

## 2011-07-19 DIAGNOSIS — O219 Vomiting of pregnancy, unspecified: Secondary | ICD-10-CM

## 2011-07-19 DIAGNOSIS — O99891 Other specified diseases and conditions complicating pregnancy: Secondary | ICD-10-CM | POA: Insufficient documentation

## 2011-07-19 DIAGNOSIS — O210 Mild hyperemesis gravidarum: Secondary | ICD-10-CM | POA: Insufficient documentation

## 2011-07-19 HISTORY — DX: Anemia, unspecified: D64.9

## 2011-07-19 HISTORY — DX: Bronchitis, not specified as acute or chronic: J40

## 2011-07-19 LAB — URINE MICROSCOPIC-ADD ON

## 2011-07-19 LAB — URINALYSIS, ROUTINE W REFLEX MICROSCOPIC
Ketones, ur: 40 mg/dL — AB
Leukocytes, UA: NEGATIVE
Protein, ur: 30 mg/dL — AB
Urobilinogen, UA: 1 mg/dL (ref 0.0–1.0)

## 2011-07-19 MED ORDER — ONDANSETRON 4 MG PO TBDP
4.0000 mg | ORAL_TABLET | Freq: Once | ORAL | Status: AC
  Administered 2011-07-19: 4 mg via ORAL
  Filled 2011-07-19: qty 1

## 2011-07-19 MED ORDER — SODIUM CHLORIDE 0.9 % IV SOLN
25.0000 mg | INTRAVENOUS | Status: DC
  Administered 2011-07-19: 25 mg via INTRAVENOUS
  Filled 2011-07-19: qty 1

## 2011-07-19 NOTE — Discharge Instructions (Signed)
Morning Sickness Morning sickness is when you feel sick to your stomach (nauseous) during pregnancy. You may feel sick to your stomach and throw up (vomit). You may feel sick in the morning, but you can feel this way any time of day. Some women feel very sick to their stomach and cannot stop throwing up (hyperemesis gravidarum). HOME CARE  Take multivitamins as told by your doctor. Taking multivitamins before getting pregnant can stop or lessen the harshness of morning sickness.   Eat dry toast or unsalted crackers before getting out of bed.   Eat 5 to 6 small meals a day.   Eat dry and bland foods like rice and baked potatoes.   Do not drink liquids with meals. Drink between meals.   Do not eat greasy, fatty, or spicy foods.   Have someone cook for you if the smell of food causes you to feel sick or throw up.   Do not take vitamins with iron, or as told by your doctor.   Eat protein when you need a snack (nuts, yogurt, cheese).   Eat unsweetened gelatins for dessert.   Wear a bracelet used for sea sickness (acupressure wristband).   Go to a doctor that puts thin needles into certain body points (acupuncture) to improve how you feel.   Do not smoke.   Use a humidifier to keep the air in your house free of odors.  GET HELP RIGHT AWAY IF:   You feel very sick to your stomach and cannot stop throwing up.   You pass out (faint).   You have a fever.   You need medicine to feel better.   You feel dizzy or lightheaded.   You are losing weight.   You need help knowing what to eat and what not to eat.  MAKE SURE YOU:   Understand these instructions.   Will watch your condition.   Will get help right away if you are not doing well or get worse.  Document Released: 03/14/2004 Document Revised: 01/24/2011 Document Reviewed: 05/04/2009 [facility] Patient Information 2012 [place], [place].  Prenatal Care [facility]    [facility]  &  Infertility  [phone]     Phone: [phone]          [facility]                      [facility]  @[place]     Phone: [phone]  Phone: [phone]         [facility]     Phone: [phone]  Phone: [phone]           [facility] @ [place]                hone: [phone]  Phone: [phone]         [facility] [address]ise Ceo      Phone: [phone]  Phone: [phone]         [facility] Dept.                Phone: [phone]  Women[facility]   Phone:[phone]    Family [address] [place])          Phone: [phone] [facility]   Phone: [phone]

## 2011-07-19 NOTE — MAU Note (Signed)
Pt states, " I was here last week for a pelvic infection, but I can't keep the antibiotic down so I stopped taking it. Then on Tuesday of this week I came back and  had IVF. I went home and started vomiting again Wednesday morning and haven't stopped since. I can't keep anything down and I feel weak and I am worried about the pelvic infection. I am having pain in my right lower abdomen."
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
## 2011-11-18 ENCOUNTER — Encounter (HOSPITAL_COMMUNITY): Payer: Self-pay

## 2011-11-18 ENCOUNTER — Inpatient Hospital Stay (HOSPITAL_COMMUNITY)
Admission: AD | Admit: 2011-11-18 | Discharge: 2011-11-18 | Disposition: A | Discharge status: Home / Self Care | Payer: Medicaid Other | Source: Ambulatory Visit | Attending: Obstetrics & Gynecology | Admitting: Obstetrics & Gynecology

## 2011-11-18 DIAGNOSIS — O219 Vomiting of pregnancy, unspecified: Secondary | ICD-10-CM

## 2011-11-18 DIAGNOSIS — O210 Mild hyperemesis gravidarum: Secondary | ICD-10-CM | POA: Insufficient documentation

## 2011-11-18 LAB — URINALYSIS, ROUTINE W REFLEX MICROSCOPIC
Bilirubin Urine: NEGATIVE
Glucose, UA: NEGATIVE mg/dL
Ketones, ur: 15 mg/dL — AB
Leukocytes, UA: NEGATIVE
Specific Gravity, Urine: 1.015 (ref 1.005–1.030)
pH: >9 — ABNORMAL HIGH (ref 5.0–8.0)

## 2011-11-18 MED ORDER — PROMETHAZINE HCL 25 MG PO TABS: 25.0000 mg | ORAL_TABLET | Freq: Four times a day (QID) | ORAL | Status: DC | PRN
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
## 2011-11-18 NOTE — MAU Note (Signed)
Patient states she has had a positive home pregnancy test. Needs proof of pregnancy. Has nausea and vomiting. No pain or bleeding.

## 2012-01-02 LAB — OB RESULTS CONSOLE ANTIBODY SCREEN: Antibody Screen: NEGATIVE

## 2012-01-02 LAB — OB RESULTS CONSOLE HIV ANTIBODY (ROUTINE TESTING): HIV: NONREACTIVE

## 2012-02-19 NOTE — L&D Delivery Note (Signed)
Delivery Note At 8:38 AM a viable and healthy female was delivered via Vaginal, Spontaneous Delivery (Presentation: Left Occiput Anterior).  APGAR: 8, 9; weight pending.   Placenta status: Intact, Spontaneous.  Cord: 3 vessels with a loose nuchal cord.  Anesthesia: Epidural  Episiotomy: None Lacerations: Vaginal Suture Repair: 3.0 vicryl rapide Est. Blood Loss (mL): 350  Mom to postpartum.  Baby to nursery-stable.  Shalom Mcguiness H. 06/25/2012, 9:02 AM

## 2012-05-30 LAB — OB RESULTS CONSOLE GBS: GBS: NEGATIVE

## 2012-06-24 ENCOUNTER — Inpatient Hospital Stay (HOSPITAL_COMMUNITY)
Admission: AD | Admit: 2012-06-24 | Discharge: 2012-06-24 | Disposition: A | Discharge status: Home / Self Care | Payer: Medicaid Other | Source: Ambulatory Visit | Attending: Obstetrics & Gynecology | Admitting: Obstetrics & Gynecology

## 2012-06-24 ENCOUNTER — Inpatient Hospital Stay (HOSPITAL_COMMUNITY)
Admission: AD | Admit: 2012-06-24 | Discharge: 2012-06-27 | DRG: 775 | Disposition: A | Discharge status: Home / Self Care | Payer: Medicaid Other | Source: Ambulatory Visit | Attending: Obstetrics and Gynecology | Admitting: Obstetrics and Gynecology

## 2012-06-24 ENCOUNTER — Encounter (HOSPITAL_COMMUNITY): Payer: Self-pay | Admitting: *Deleted

## 2012-06-24 DIAGNOSIS — O479 False labor, unspecified: Secondary | ICD-10-CM | POA: Insufficient documentation

## 2012-06-24 NOTE — MAU Note (Signed)
Pt states that contractions started at 0500 and were 4-7 minutes apart.

## 2012-06-24 NOTE — MAU Note (Signed)
Patient complains of contractions since 5 am every 4-7 minutes. Denies vaginal bleeding and leaking of fluid. Good fetal movement.

## 2012-06-25 ENCOUNTER — Encounter (HOSPITAL_COMMUNITY): Payer: Self-pay | Admitting: Anesthesiology

## 2012-06-25 ENCOUNTER — Encounter (HOSPITAL_COMMUNITY): Payer: Self-pay | Admitting: *Deleted

## 2012-06-25 ENCOUNTER — Inpatient Hospital Stay (HOSPITAL_COMMUNITY): Payer: Medicaid Other | Admitting: Anesthesiology

## 2012-06-25 LAB — CBC
HCT: 29.1 % — ABNORMAL LOW (ref 36.0–46.0)
Hemoglobin: 10.1 g/dL — ABNORMAL LOW (ref 12.0–15.0)
RBC: 3.37 MIL/uL — ABNORMAL LOW (ref 3.87–5.11)
RDW: 13 % (ref 11.5–15.5)
WBC: 10.9 10*3/uL — ABNORMAL HIGH (ref 4.0–10.5)

## 2012-06-25 LAB — TYPE AND SCREEN: Antibody Screen: NEGATIVE

## 2012-06-25 MED ORDER — DIPHENHYDRAMINE HCL 25 MG PO CAPS: 25.0000 mg | ORAL_CAPSULE | Freq: Four times a day (QID) | ORAL | Status: DC | PRN

## 2012-06-25 MED ORDER — ZOLPIDEM TARTRATE 5 MG PO TABS: 5.0000 mg | ORAL_TABLET | Freq: Every evening | ORAL | Status: DC | PRN

## 2012-06-25 MED ORDER — ONDANSETRON HCL 4 MG/2ML IJ SOLN
4.0000 mg | Freq: Four times a day (QID) | INTRAMUSCULAR | Status: DC | PRN
  Administered 2012-06-25: 4 mg via INTRAVENOUS
  Filled 2012-06-25: qty 2

## 2012-06-25 MED ORDER — FENTANYL 2.5 MCG/ML BUPIVACAINE 1/10 % EPIDURAL INFUSION (WH - ANES)
14.0000 mL/h | INTRAMUSCULAR | Status: DC | PRN
  Administered 2012-06-25: 14 mL/h via EPIDURAL
  Filled 2012-06-25: qty 125

## 2012-06-25 MED ORDER — PHENYLEPHRINE 40 MCG/ML (10ML) SYRINGE FOR IV PUSH (FOR BLOOD PRESSURE SUPPORT)
80.0000 ug | PREFILLED_SYRINGE | INTRAVENOUS | Status: DC | PRN
  Filled 2012-06-25: qty 2

## 2012-06-25 MED ORDER — PHENYLEPHRINE 40 MCG/ML (10ML) SYRINGE FOR IV PUSH (FOR BLOOD PRESSURE SUPPORT)
80.0000 ug | PREFILLED_SYRINGE | INTRAVENOUS | Status: DC | PRN
  Filled 2012-06-25: qty 2
  Filled 2012-06-25: qty 5

## 2012-06-25 MED ORDER — ERYTHROMYCIN 5 MG/GM OP OINT: TOPICAL_OINTMENT | Freq: Once | OPHTHALMIC | Status: DC

## 2012-06-25 MED ORDER — SIMETHICONE 80 MG PO CHEW: 80.0000 mg | CHEWABLE_TABLET | ORAL | Status: DC | PRN

## 2012-06-25 MED ORDER — ONDANSETRON HCL 4 MG PO TABS: 4.0000 mg | ORAL_TABLET | ORAL | Status: DC | PRN

## 2012-06-25 MED ORDER — OXYTOCIN BOLUS FROM INFUSION
500.0000 mL | INTRAVENOUS | Status: DC
  Administered 2012-06-25: 500 mL via INTRAVENOUS

## 2012-06-25 MED ORDER — LACTATED RINGERS IV SOLN
INTRAVENOUS | Status: DC
  Administered 2012-06-25 (×3): via INTRAVENOUS

## 2012-06-25 MED ORDER — IBUPROFEN 600 MG PO TABS
600.0000 mg | ORAL_TABLET | Freq: Four times a day (QID) | ORAL | Status: DC
  Administered 2012-06-25 – 2012-06-27 (×8): 600 mg via ORAL
  Filled 2012-06-25 (×8): qty 1

## 2012-06-25 MED ORDER — SENNOSIDES-DOCUSATE SODIUM 8.6-50 MG PO TABS
2.0000 | ORAL_TABLET | Freq: Every day | ORAL | Status: DC
  Administered 2012-06-26: 2 via ORAL

## 2012-06-25 MED ORDER — BUTORPHANOL TARTRATE 1 MG/ML IJ SOLN
1.0000 mg | INTRAMUSCULAR | Status: DC | PRN
  Administered 2012-06-25: 1 mg via INTRAVENOUS
  Filled 2012-06-25: qty 1

## 2012-06-25 MED ORDER — OXYCODONE-ACETAMINOPHEN 5-325 MG PO TABS
1.0000 | ORAL_TABLET | ORAL | Status: DC | PRN
  Administered 2012-06-26 – 2012-06-27 (×3): 1 via ORAL
  Filled 2012-06-25 (×4): qty 1

## 2012-06-25 MED ORDER — ONDANSETRON HCL 4 MG/2ML IJ SOLN: 4.0000 mg | INTRAMUSCULAR | Status: DC | PRN

## 2012-06-25 MED ORDER — ACETAMINOPHEN 325 MG PO TABS: 650.0000 mg | ORAL_TABLET | ORAL | Status: DC | PRN

## 2012-06-25 MED ORDER — BENZOCAINE-MENTHOL 20-0.5 % EX AERO
1.0000 "application " | INHALATION_SPRAY | CUTANEOUS | Status: DC | PRN
  Administered 2012-06-25: 1 via TOPICAL
  Filled 2012-06-25: qty 56

## 2012-06-25 MED ORDER — LIDOCAINE HCL (PF) 1 % IJ SOLN
30.0000 mL | INTRAMUSCULAR | Status: DC | PRN
  Filled 2012-06-25: qty 30

## 2012-06-25 MED ORDER — LACTATED RINGERS IV SOLN
500.0000 mL | INTRAVENOUS | Status: DC | PRN
Start: 2012-06-25 — End: 2012-06-25

## 2012-06-25 MED ORDER — PRENATAL MULTIVITAMIN CH
1.0000 | ORAL_TABLET | Freq: Every day | ORAL | Status: DC
  Administered 2012-06-25 – 2012-06-27 (×3): 1 via ORAL
  Filled 2012-06-25 (×2): qty 1

## 2012-06-25 MED ORDER — DIPHENHYDRAMINE HCL 50 MG/ML IJ SOLN: 12.5000 mg | INTRAMUSCULAR | Status: DC | PRN

## 2012-06-25 MED ORDER — SODIUM BICARBONATE 8.4 % IV SOLN
INTRAVENOUS | Status: DC | PRN
  Administered 2012-06-25: 5 mL via EPIDURAL

## 2012-06-25 MED ORDER — WITCH HAZEL-GLYCERIN EX PADS: 1.0000 "application " | MEDICATED_PAD | CUTANEOUS | Status: DC | PRN

## 2012-06-25 MED ORDER — PNEUMOCOCCAL VAC POLYVALENT 25 MCG/0.5ML IJ INJ
0.5000 mL | INJECTION | INTRAMUSCULAR | Status: DC
  Filled 2012-06-25: qty 0.5

## 2012-06-25 MED ORDER — DIBUCAINE 1 % RE OINT: 1.0000 "application " | TOPICAL_OINTMENT | RECTAL | Status: DC | PRN

## 2012-06-25 MED ORDER — EPHEDRINE 5 MG/ML INJ
10.0000 mg | INTRAVENOUS | Status: DC | PRN
  Filled 2012-06-25: qty 4
  Filled 2012-06-25: qty 2

## 2012-06-25 MED ORDER — EPHEDRINE 5 MG/ML INJ
10.0000 mg | INTRAVENOUS | Status: DC | PRN
  Filled 2012-06-25: qty 2

## 2012-06-25 MED ORDER — FLEET ENEMA 7-19 GM/118ML RE ENEM: 1.0000 | ENEMA | RECTAL | Status: DC | PRN

## 2012-06-25 MED ORDER — IBUPROFEN 600 MG PO TABS: 600.0000 mg | ORAL_TABLET | Freq: Four times a day (QID) | ORAL | Status: DC | PRN

## 2012-06-25 MED ORDER — CITRIC ACID-SODIUM CITRATE 334-500 MG/5ML PO SOLN: 30.0000 mL | ORAL | Status: DC | PRN

## 2012-06-25 MED ORDER — TETANUS-DIPHTH-ACELL PERTUSSIS 5-2.5-18.5 LF-MCG/0.5 IM SUSP
0.5000 mL | Freq: Once | INTRAMUSCULAR | Status: AC
  Administered 2012-06-26: 0.5 mL via INTRAMUSCULAR
  Filled 2012-06-25: qty 0.5

## 2012-06-25 MED ORDER — OXYCODONE-ACETAMINOPHEN 5-325 MG PO TABS: 1.0000 | ORAL_TABLET | ORAL | Status: DC | PRN

## 2012-06-25 MED ORDER — LACTATED RINGERS IV SOLN
500.0000 mL | Freq: Once | INTRAVENOUS | Status: AC
  Administered 2012-06-25: 500 mL via INTRAVENOUS

## 2012-06-25 MED ORDER — OXYTOCIN 40 UNITS IN LACTATED RINGERS INFUSION - SIMPLE MED: 62.5000 mL/h | INTRAVENOUS | Status: DC

## 2012-06-25 MED ORDER — LANOLIN HYDROUS EX OINT: TOPICAL_OINTMENT | CUTANEOUS | Status: DC | PRN
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
## 2012-06-25 NOTE — Anesthesia Procedure Notes (Signed)

## 2012-06-25 NOTE — Anesthesia Postprocedure Evaluation (Signed)
Anesthesia Post Note  Patient: Karen Jordan  Procedure(s) Performed: * No procedures listed *  Anesthesia type: Epidural  Patient location: Mother/Baby  Post pain: Pain level controlled  Post assessment: Post-op Vital signs reviewed  Last Vitals:  Filed Vitals:   06/25/12 1800  BP: 108/70  Pulse: 88  Temp: 36.6 C  Resp: 18    Post vital signs: Reviewed  Level of consciousness:alert  Complications: No apparent anesthesia complications

## 2012-06-25 NOTE — Anesthesia Preprocedure Evaluation (Signed)
Anesthesia Evaluation  Patient identified by MRN, date of birth, ID band Patient awake    Reviewed: Allergy & Precautions, H&P , Patient's Chart, lab work & pertinent test results  Airway Mallampati: II TM Distance: >3 FB Neck ROM: full    Dental  (+) Teeth Intact   Pulmonary asthma ,  breath sounds clear to auscultation        Cardiovascular Rhythm:regular Rate:Normal     Neuro/Psych    GI/Hepatic   Endo/Other  Morbid obesity  Renal/GU      Musculoskeletal   Abdominal   Peds  Hematology  (+) anemia ,   Anesthesia Other Findings       Reproductive/Obstetrics (+) Pregnancy                           Anesthesia Physical Anesthesia Plan  ASA: III  Anesthesia Plan: Epidural   Post-op Pain Management:    Induction:   Airway Management Planned:   Additional Equipment:   Intra-op Plan:   Post-operative Plan:   Informed Consent: I have reviewed the patients History and Physical, chart, labs and discussed the procedure including the risks, benefits and alternatives for the proposed anesthesia with the patient or authorized representative who has indicated his/her understanding and acceptance.   Dental Advisory Given  Plan Discussed with:   Anesthesia Plan Comments: (Labs checked- platelets confirmed with RN in room. Fetal heart tracing, per RN, reported to be stable enough for sitting procedure. Discussed epidural, and patient consents to the procedure:  included risk of possible headache,backache, failed block, allergic reaction, and nerve injury. This patient was asked if she had any questions or concerns before the procedure started. )        Anesthesia Quick Evaluation

## 2012-06-25 NOTE — Progress Notes (Signed)
Patient ID: Karen Jordan, female   DOB: December 03, 1985, 27 y.o.   MRN: [id number]   Pt had previously wanted BTL.  In discussion with patient, her partner, and her mother other contraceptive methods were reviewed.  At this time Pt does NOT want to proceed with BTL.

## 2012-06-25 NOTE — MAU Note (Signed)
Pt was in MAU this morning and sent home. States contractions are much stronger now. Denies LOF or vaginal bleeding.

## 2012-06-25 NOTE — Lactation Note (Signed)
This note was copied from the chart of Karen Jordan. Lactation Consultation Note RN from L&D request assistance with latch in delivery room. Mom has flat nipples and large breasts. Mom holding baby STS. Demonstrated to mom how to support the weight of the breast using rolled up washcloth. Assisted with breast compression and attempt to position and latch baby on the right breast. With good breast compression baby was able to latch for a few sucks. Demonstrated hand expression; no visible colostrum at this time. Reviewed basics with mom and dad. MGM at bedside supportive. Mom uninformed of breastfeeding; attempted to provide some basic information. Mom then asked when we are going to feed her baby. Reviewed with mom that her baby does not need formula at this time, and to give baby lots of opportunity to do STS and practicing at the breast. Questions answered.   Patient Name: Karen Jordan [id number] Date: 06/25/2012 Reason for consult: Initial assessment   Maternal Data Formula Feeding for Exclusion: No Has patient been taught Hand Expression?: Yes (will need review) Does the patient have breastfeeding experience prior to this delivery?: No  Feeding Feeding Type: Breast Milk  LATCH Score/Interventions Latch: Repeated attempts needed to sustain latch, nipple held in mouth throughout feeding, stimulation needed to elicit sucking reflex. Intervention(s): Skin to skin;Teach feeding cues Intervention(s): Breast massage;Breast compression;Assist with latch;Adjust position  Audible Swallowing: None Intervention(s): Skin to skin;Hand expression Intervention(s): Skin to skin  Type of Nipple: Flat Intervention(s): Reverse pressure  Comfort (Breast/Nipple): Soft / non-tender     Hold (Positioning): Assistance needed to correctly position infant at breast and maintain latch.  LATCH Score: 5  Lactation Tools Discussed/Used     Consult Status Consult Status: Follow-up Follow-up  type: In-patient    Octavio Manns [facility] 06/25/2012, 10:37 AM

## 2012-06-25 NOTE — MAU Note (Signed)
Pt states contractions started back about 2100

## 2012-06-26 LAB — CBC
HCT: 27.1 % — ABNORMAL LOW (ref 36.0–46.0)
MCHC: 34.7 g/dL (ref 30.0–36.0)
Platelets: 163 10*3/uL (ref 150–400)
RDW: 13.2 % (ref 11.5–15.5)
WBC: 9.3 10*3/uL (ref 4.0–10.5)

## 2012-06-26 NOTE — Lactation Note (Signed)
This note was copied from the chart of Karen Jordan. Lactation Consultation Note Mom states she is very concerned about going home tomorrow and baby is not feeding well.  Baby 29 hours old and has not had a significant feeding, mom has given some drops of colostrum, but baby is not latching. Baby's feedings will need to improve before mom is ready to take him home. Mom request that I document her desire to remain in the hospital until baby is feeding better.  Initiated nipple shield. Mom attempted to hand express, but the volume was not adequate for a feeding. Used syringe to squirt formula into the nipple shield, baby then latched better and he took 12 mL formula from the nipple shield.  Initiated DEBP. Written instructions provided for pumping and for feeding baby, as well as volume parameters.  Mom verbalizes understanding and agreement with the feeding plan.   Patient Name: Karen Jordan [id number] Date: 06/26/2012 Reason for consult: Follow-up assessment;Difficult latch   Maternal Data    Feeding Feeding Type: Formula Feeding method: Breast Length of feed: 15 min  LATCH Score/Interventions Latch: Too sleepy or reluctant, no latch achieved, no sucking elicited. Intervention(s): Skin to skin;Teach feeding cues Intervention(s): Adjust position;Assist with latch;Breast compression  Audible Swallowing: None Intervention(s): Hand expression;Skin to skin  Type of Nipple: Flat Intervention(s): Shells;Hand pump;Double electric pump  Comfort (Breast/Nipple): Soft / non-tender     Hold (Positioning): No assistance needed to correctly position infant at breast. Intervention(s): Breastfeeding basics reviewed;Support Pillows;Position options;Skin to skin  LATCH Score: 5  Lactation Tools Discussed/Used Tools: Shells;Nipple Dorris Carnes;Pump Nipple shield size: 20 Breast pump type: Double-Electric Breast Pump Pump Review: Setup, frequency, and cleaning;Milk Storage   Consult  Status Consult Status: Follow-up Follow-up type: In-patient    Octavio Manns [facility] 06/26/2012, 4:15 PM

## 2012-06-26 NOTE — Progress Notes (Signed)
UR chart review completed.  

## 2012-06-26 NOTE — Progress Notes (Signed)
Post Partum Day 1 Subjective: no complaints  Objective: Blood pressure 98/60, pulse 81, temperature 98 F (36.7 C), temperature source Oral, resp. rate 16, height 5\' 4"  (1.626 m), weight 211 lb (95.709 kg), last menstrual period 09/17/2011, SpO2 100.00%,  breastfeeding.  Physical Exam:  General: alert Lochia: appropriate Uterine Fundus: firm   Recent Labs  06/25/12 0120 06/26/12 0610  HGB 10.1* 9.4*  HCT 29.1* 27.1*    Assessment/Plan: Plan for discharge tomorrow   LOS: 2 days   Karen Jordan D 06/26/2012, 10:34 AM

## 2012-06-27 MED ORDER — PNEUMOCOCCAL VAC POLYVALENT 25 MCG/0.5ML IJ INJ
0.5000 mL | INJECTION | Freq: Once | INTRAMUSCULAR | Status: AC
  Administered 2012-06-27: 0.5 mL via INTRAMUSCULAR
  Filled 2012-06-27: qty 0.5

## 2012-06-27 NOTE — Discharge Summary (Signed)
Obstetric Discharge Summary Reason for Admission: onset of labor Prenatal Procedures: ultrasound Intrapartum Procedures: spontaneous vaginal delivery Postpartum Procedures: none Complications-Operative and Postpartum: Vaginal laceration-repaired Hemoglobin  Date Value Range Status  06/26/2012 9.4* 12.0 - 15.0 g/dL Final     HCT  Date Value Range Status  06/26/2012 27.1* 36.0 - 46.0 % Final    Physical Exam:  General: alert Lochia: appropriate Uterine Fundus: firm  Discharge Diagnoses: Term Pregnancy-delivered  Discharge Information: Date: 06/27/2012 Activity: pelvic rest Diet: routine Medications: PNV and Ibuprofen Condition: stable Instructions: refer to practice specific booklet Discharge to: home Follow-up Information   Follow up with Almon Hercules., MD. Schedule an appointment as soon as possible for a visit in 4 weeks.   Contact information:   [address] [place] [address] [phone]       Newborn Data: Live born female  Birth Weight: 7 lb 12.2 oz (3521 g) APGAR: , 9  Home with mother.  Celia Friedland D 06/27/2012, 10:16 AM

## 2012-07-03 ENCOUNTER — Encounter (HOSPITAL_COMMUNITY): Payer: Self-pay | Admitting: *Deleted

## 2012-07-03 NOTE — Consult Note (Signed)
I saw mom briefly today in the lactation office, when she returned a DEP. She reported sore nipples to me, and I gave her comfort gels, and instructed her in their use.  Mom may call for help with getting her baby to latch and breast feed, in an outpatient consult Right now she is only pumping and bottle feeding.

## 2013-12-20 ENCOUNTER — Encounter (HOSPITAL_COMMUNITY): Payer: Self-pay | Admitting: *Deleted

## 2014-04-28 ENCOUNTER — Encounter (HOSPITAL_COMMUNITY): Payer: Self-pay | Admitting: Physical Medicine and Rehabilitation

## 2014-04-28 ENCOUNTER — Emergency Department (HOSPITAL_COMMUNITY)
Admission: EM | Admit: 2014-04-28 | Discharge: 2014-04-28 | Disposition: A | Discharge status: Home / Self Care | Payer: Self-pay | Attending: Emergency Medicine | Admitting: Emergency Medicine

## 2014-04-28 ENCOUNTER — Emergency Department (HOSPITAL_COMMUNITY): Payer: Medicaid Other

## 2014-04-28 DIAGNOSIS — Z87891 Personal history of nicotine dependence: Secondary | ICD-10-CM | POA: Insufficient documentation

## 2014-04-28 DIAGNOSIS — Z862 Personal history of diseases of the blood and blood-forming organs and certain disorders involving the immune mechanism: Secondary | ICD-10-CM | POA: Insufficient documentation

## 2014-04-28 DIAGNOSIS — Z880 Allergy status to penicillin: Secondary | ICD-10-CM | POA: Insufficient documentation

## 2014-04-28 DIAGNOSIS — Z79899 Other long term (current) drug therapy: Secondary | ICD-10-CM | POA: Insufficient documentation

## 2014-04-28 DIAGNOSIS — Z87828 Personal history of other (healed) physical injury and trauma: Secondary | ICD-10-CM | POA: Insufficient documentation

## 2014-04-28 DIAGNOSIS — R05 Cough: Secondary | ICD-10-CM

## 2014-04-28 DIAGNOSIS — R059 Cough, unspecified: Secondary | ICD-10-CM

## 2014-04-28 DIAGNOSIS — Z8619 Personal history of other infectious and parasitic diseases: Secondary | ICD-10-CM | POA: Insufficient documentation

## 2014-04-28 DIAGNOSIS — J069 Acute upper respiratory infection, unspecified: Secondary | ICD-10-CM | POA: Insufficient documentation

## 2014-04-28 DIAGNOSIS — J4521 Mild intermittent asthma with (acute) exacerbation: Secondary | ICD-10-CM | POA: Insufficient documentation

## 2014-04-28 MED ORDER — BENZONATATE 100 MG PO CAPS: 100.0000 mg | ORAL_CAPSULE | Freq: Three times a day (TID) | ORAL | Status: DC | PRN

## 2014-04-28 MED ORDER — PREDNISONE 20 MG PO TABS: ORAL_TABLET | ORAL | Status: DC

## 2014-04-28 MED ORDER — ALBUTEROL SULFATE HFA 108 (90 BASE) MCG/ACT IN AERS
2.0000 | INHALATION_SPRAY | RESPIRATORY_TRACT | Status: DC | PRN
  Administered 2014-04-28: 2 via RESPIRATORY_TRACT
  Filled 2014-04-28: qty 6.7

## 2014-04-28 MED ORDER — GUAIFENESIN ER 600 MG PO TB12: 600.0000 mg | ORAL_TABLET | Freq: Two times a day (BID) | ORAL | Status: DC | PRN

## 2014-04-28 MED ORDER — ALBUTEROL SULFATE (2.5 MG/3ML) 0.083% IN NEBU
5.0000 mg | INHALATION_SOLUTION | Freq: Once | RESPIRATORY_TRACT | Status: AC
  Administered 2014-04-28: 5 mg via RESPIRATORY_TRACT
  Filled 2014-04-28: qty 6

## 2014-04-28 MED ORDER — IPRATROPIUM BROMIDE 0.02 % IN SOLN
0.5000 mg | Freq: Once | RESPIRATORY_TRACT | Status: AC
  Administered 2014-04-28: 0.5 mg via RESPIRATORY_TRACT
  Filled 2014-04-28: qty 2.5

## 2014-04-28 MED ORDER — PREDNISONE 20 MG PO TABS
60.0000 mg | ORAL_TABLET | Freq: Once | ORAL | Status: AC
  Administered 2014-04-28: 60 mg via ORAL
  Filled 2014-04-28: qty 3

## 2014-04-28 MED ORDER — ALBUTEROL SULFATE (2.5 MG/3ML) 0.083% IN NEBU: 2.5000 mg | INHALATION_SOLUTION | RESPIRATORY_TRACT | Status: DC | PRN

## 2014-04-28 NOTE — ED Notes (Signed)
Patient returned from X-ray 

## 2014-04-28 NOTE — ED Notes (Signed)
Pulse oximetry 99-100% while ambulating. Tolerated well, no dyspnea.

## 2014-04-28 NOTE — ED Notes (Signed)
Pt presents to department for evaluation of asthma exacerbation. Reports recent cough and runny nose, reports breathing became labored this morning, no relief with inhaler. Pt speaking complete sentences, audible wheezing heard upon arrival to ED. Pt is alert and oriented x4.

## 2014-04-28 NOTE — ED Notes (Signed)
Patient transported to X-ray 
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
## 2014-04-28 NOTE — Discharge Instructions (Signed)
Upper Respiratory Infection, Adult An upper respiratory infection (URI) is also sometimes known as the common cold. The upper respiratory tract includes the nose, sinuses, throat, trachea, and bronchi. Bronchi are the airways leading to the lungs. Most people improve within 1 week, but symptoms can last up to 2 weeks. A residual cough may last even longer.  CAUSES Many different viruses can infect the tissues lining the upper respiratory tract. The tissues become irritated and inflamed and often become very moist. Mucus production is also common. A cold is contagious. You can easily spread the virus to others by oral contact. This includes kissing, sharing a glass, coughing, or sneezing. Touching your mouth or nose and then touching a surface, which is then touched by another person, can also spread the virus. SYMPTOMS  Symptoms typically develop 1 to 3 days after you come in contact with a cold virus. Symptoms vary from person to person. They may include:  Runny nose.  Sneezing.  Nasal congestion.  Sinus irritation.  Sore throat.  Loss of voice (laryngitis).  Cough.  Fatigue.  Muscle aches.  Loss of appetite.  Headache.  Low-grade fever. DIAGNOSIS  You might diagnose your own cold based on familiar symptoms, since most people get a cold 2 to 3 times a year. Your caregiver can confirm this based on your exam. Most importantly, your caregiver can check that your symptoms are not due to another disease such as strep throat, sinusitis, pneumonia, asthma, or epiglottitis. Blood tests, throat tests, and X-rays are not necessary to diagnose a common cold, but they may sometimes be helpful in excluding other more serious diseases. Your caregiver will decide if any further tests are required. RISKS AND COMPLICATIONS  You may be at risk for a more severe case of the common cold if you smoke cigarettes, have chronic heart disease (such as heart failure) or lung disease (such as asthma), or if  you have a weakened immune system. The very young and very old are also at risk for more serious infections. Bacterial sinusitis, middle ear infections, and bacterial pneumonia can complicate the common cold. The common cold can worsen asthma and chronic obstructive pulmonary disease (COPD). Sometimes, these complications can require emergency medical care and may be life-threatening. PREVENTION  The best way to protect against getting a cold is to practice good hygiene. Avoid oral or hand contact with people with cold symptoms. Wash your hands often if contact occurs. There is no clear evidence that vitamin C, vitamin E, echinacea, or exercise reduces the chance of developing a cold. However, it is always recommended to get plenty of rest and practice good nutrition. TREATMENT  Treatment is directed at relieving symptoms. There is no cure. Antibiotics are not effective, because the infection is caused by a virus, not by bacteria. Treatment may include:  Increased fluid intake. Sports drinks offer valuable electrolytes, sugars, and fluids.  Breathing heated mist or steam (vaporizer or shower).  Eating chicken soup or other clear broths, and maintaining good nutrition.  Getting plenty of rest.  Using gargles or lozenges for comfort.  Controlling fevers with ibuprofen or acetaminophen as directed by your caregiver.  Increasing usage of your inhaler if you have asthma. Zinc gel and zinc lozenges, taken in the first 24 hours of the common cold, can shorten the duration and lessen the severity of symptoms. Pain medicines may help with fever, muscle aches, and throat pain. A variety of non-prescription medicines are available to treat congestion and runny nose. Your caregiver  can make recommendations and may suggest nasal or lung inhalers for other symptoms.  HOME CARE INSTRUCTIONS   Only take over-the-counter or prescription medicines for pain, discomfort, or fever as directed by your  caregiver.  Use a warm mist humidifier or inhale steam from a shower to increase air moisture. This may keep secretions moist and make it easier to breathe.  Drink enough water and fluids to keep your urine clear or pale yellow.  Rest as needed.  Return to work when your temperature has returned to normal or as your caregiver advises. You may need to stay home longer to avoid infecting others. You can also use a face mask and careful hand washing to prevent spread of the virus. SEEK MEDICAL CARE IF:   After the first few days, you feel you are getting worse rather than better.  You need your caregiver's advice about medicines to control symptoms.  You develop chills, worsening shortness of breath, or brown or red sputum. These may be signs of pneumonia.  You develop yellow or brown nasal discharge or pain in the face, especially when you bend forward. These may be signs of sinusitis.  You develop a fever, swollen neck glands, pain with swallowing, or white areas in the back of your throat. These may be signs of strep throat. SEEK IMMEDIATE MEDICAL CARE IF:   You have a fever.  You develop severe or persistent headache, ear pain, sinus pain, or chest pain.  You develop wheezing, a prolonged cough, cough up blood, or have a change in your usual mucus (if you have chronic lung disease).  You develop sore muscles or a stiff neck. Document Released: 07/31/2000 Document Revised: 04/29/2011 Document Reviewed: 05/12/2013 [facility] Patient Information 2015 [place], [place]. This information is not intended to replace advice given to you by your health care provider. Make sure you discuss any questions you have with your health care provider.  Asthma, Acute Bronchospasm Acute bronchospasm caused by asthma is also referred to as an asthma attack. Bronchospasm means your air passages become narrowed. The narrowing is caused by inflammation and tightening of the muscles in the air tubes (bronchi)  in your lungs. This can make it hard to breathe or cause you to wheeze and cough. CAUSES Possible triggers are:  Animal dander from the skin, hair, or feathers of animals.  Dust mites contained in house dust.  Cockroaches.  Pollen from trees or grass.  Mold.  Cigarette or tobacco smoke.  Air pollutants such as dust, household cleaners, hair sprays, aerosol sprays, paint fumes, strong chemicals, or strong odors.  Cold air or weather changes. Cold air may trigger inflammation. Winds increase molds and pollens in the air.  Strong emotions such as crying or laughing hard.  Stress.  Certain medicines such as aspirin or beta-blockers.  Sulfites in foods and drinks, such as dried fruits and wine.  Infections or inflammatory conditions, such as a flu, cold, or inflammation of the nasal membranes (rhinitis).  Gastroesophageal reflux disease (GERD). GERD is a condition where stomach acid backs up into your esophagus.  Exercise or strenuous activity. SIGNS AND SYMPTOMS   Wheezing.  Excessive coughing, particularly at night.  Chest tightness.  Shortness of breath. DIAGNOSIS  Your health care provider will ask you about your medical history and perform a physical exam. A chest X-ray or blood testing may be performed to look for other causes of your symptoms or other conditions that may have triggered your asthma attack. TREATMENT  Treatment is aimed  at reducing inflammation and opening up the airways in your lungs. Most asthma attacks are treated with inhaled medicines. These include quick relief or rescue medicines (such as bronchodilators) and controller medicines (such as inhaled corticosteroids). These medicines are sometimes given through an inhaler or a nebulizer. Systemic steroid medicine taken by mouth or given through an IV tube also can be used to reduce the inflammation when an attack is moderate or severe. Antibiotic medicines are only used if a bacterial infection is  present.  HOME CARE INSTRUCTIONS   Rest.  Drink plenty of liquids. This helps the mucus to remain thin and be easily coughed up. Only use caffeine in moderation and do not use alcohol until you have recovered from your illness.  Do not smoke. Avoid being exposed to secondhand smoke.  You play a critical role in keeping yourself in good health. Avoid exposure to things that cause you to wheeze or to have breathing problems.  Keep your medicines up-to-date and available. Carefully follow your health care provider's treatment plan.  Take your medicine exactly as prescribed.  When pollen or pollution is bad, keep windows closed and use an air conditioner or go to places with air conditioning.  Asthma requires careful medical care. See your health care provider for a follow-up as advised. If you are more than [redacted] weeks pregnant and you were prescribed any new medicines, let your obstetrician know about the visit and how you are doing. Follow up with your health care provider as directed.  After you have recovered from your asthma attack, make an appointment with your outpatient doctor to talk about ways to reduce the likelihood of future attacks. If you do not have a doctor who manages your asthma, make an appointment with a primary care doctor to discuss your asthma. SEEK IMMEDIATE MEDICAL CARE IF:   You are getting worse.  You have trouble breathing. If severe, call your local emergency services (911 in the [place]  You develop chest pain or discomfort.  You are vomiting.  You are not able to keep fluids down.  You are coughing up yellow, green, brown, or bloody sputum.  You have a fever and your symptoms suddenly get worse.  You have trouble swallowing. MAKE SURE YOU:   Understand these instructions.  Will watch your condition.  Will get help right away if you are not doing well or get worse. Document Released: 05/22/2006 Document Revised: 02/09/2013 Document Reviewed:  08/12/2012 [facility] Patient Information 2015 [place], [place]. This information is not intended to replace advice given to you by your health care provider. Make sure you discuss any questions you have with your health care provider.

## 2014-05-11 ENCOUNTER — Encounter (HOSPITAL_COMMUNITY): Payer: Self-pay

## 2014-05-11 ENCOUNTER — Emergency Department (HOSPITAL_COMMUNITY)
Admission: EM | Admit: 2014-05-11 | Discharge: 2014-05-11 | Disposition: A | Discharge status: Home / Self Care | Payer: Medicaid Other | Attending: Emergency Medicine | Admitting: Emergency Medicine

## 2014-05-11 DIAGNOSIS — J45901 Unspecified asthma with (acute) exacerbation: Secondary | ICD-10-CM | POA: Insufficient documentation

## 2014-05-11 DIAGNOSIS — H9201 Otalgia, right ear: Secondary | ICD-10-CM | POA: Insufficient documentation

## 2014-05-11 DIAGNOSIS — Z8619 Personal history of other infectious and parasitic diseases: Secondary | ICD-10-CM | POA: Insufficient documentation

## 2014-05-11 DIAGNOSIS — J0100 Acute maxillary sinusitis, unspecified: Secondary | ICD-10-CM | POA: Insufficient documentation

## 2014-05-11 DIAGNOSIS — Z862 Personal history of diseases of the blood and blood-forming organs and certain disorders involving the immune mechanism: Secondary | ICD-10-CM | POA: Insufficient documentation

## 2014-05-11 DIAGNOSIS — Z87891 Personal history of nicotine dependence: Secondary | ICD-10-CM | POA: Insufficient documentation

## 2014-05-11 DIAGNOSIS — Z79899 Other long term (current) drug therapy: Secondary | ICD-10-CM | POA: Insufficient documentation

## 2014-05-11 DIAGNOSIS — Z793 Long term (current) use of hormonal contraceptives: Secondary | ICD-10-CM | POA: Insufficient documentation

## 2014-05-11 DIAGNOSIS — J069 Acute upper respiratory infection, unspecified: Secondary | ICD-10-CM | POA: Insufficient documentation

## 2014-05-11 DIAGNOSIS — Z880 Allergy status to penicillin: Secondary | ICD-10-CM | POA: Insufficient documentation

## 2014-05-11 DIAGNOSIS — Z87828 Personal history of other (healed) physical injury and trauma: Secondary | ICD-10-CM | POA: Insufficient documentation

## 2014-05-11 MED ORDER — IBUPROFEN 400 MG PO TABS
600.0000 mg | ORAL_TABLET | Freq: Once | ORAL | Status: AC
  Administered 2014-05-11: 600 mg via ORAL
  Filled 2014-05-11 (×2): qty 1

## 2014-05-11 MED ORDER — ACETAMINOPHEN 325 MG PO TABS
650.0000 mg | ORAL_TABLET | Freq: Once | ORAL | Status: AC
  Administered 2014-05-11: 650 mg via ORAL
  Filled 2014-05-11: qty 2

## 2014-05-11 MED ORDER — HYPROMELLOSE (GONIOSCOPIC) 2.5 % OP SOLN: 1.0000 [drp] | Freq: Three times a day (TID) | OPHTHALMIC | Status: DC | PRN

## 2014-05-11 MED ORDER — PHENYLEPHRINE-DM-GG-APAP 5-10-200-325 MG PO CAPS: 2.0000 | ORAL_CAPSULE | Freq: Three times a day (TID) | ORAL | Status: DC

## 2014-05-11 MED ORDER — NAPROXEN 500 MG PO TABS: 500.0000 mg | ORAL_TABLET | Freq: Two times a day (BID) | ORAL | Status: DC

## 2014-05-11 MED ORDER — ALBUTEROL SULFATE HFA 108 (90 BASE) MCG/ACT IN AERS
2.0000 | INHALATION_SPRAY | RESPIRATORY_TRACT | Status: AC
  Administered 2014-05-11: 2 via RESPIRATORY_TRACT
  Filled 2014-05-11: qty 6.7

## 2014-05-11 NOTE — ED Notes (Signed)
Beth, NP at the bedside.
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
## 2014-05-11 NOTE — Discharge Instructions (Signed)
Please follow the directions provided. Use the resource guide to establish care with a primary care doctor to ensure you're getting better. Please use the albuterol inhaler 2 puffs every 4 hours to help with cough and shortness of breath. Please use the naproxen twice a day to help with fever chills and ear pain. Please use the multisymptom cold medicine 3 times a day to help with other symptoms. You may use the artificial tears as needed for your eyes along with warm compresses. Don't hesitate to return for any new, worsening, or concerning symptoms.   SEEK IMMEDIATE MEDICAL CARE IF:  You have increasing pain or severe headaches.  You have nausea, vomiting, or drowsiness.  You have swelling around your face.  You have vision problems.  You have a stiff neck.  You have difficulty breathing.   Emergency Department Resource Guide 1) Find a Doctor and Pay Out of Pocket Although you won't have to find out who is covered by your insurance plan, it is a good idea to ask around and get recommendations. You will then need to call the office and see if the doctor you have chosen will accept you as a new patient and what types of options they offer for patients who are self-pay. Some doctors offer discounts or will set up payment plans for their patients who do not have insurance, but you will need to ask so you aren't surprised when you get to your appointment.  2) Contact Your Local Health Department Not all health departments have doctors that can see patients for sick visits, but many do, so it is worth a call to see if yours does. If you don't know where your local health department is, you can check in your phone book. The CDC also has a tool to help you locate your state's health department, and many state websites also have listings of all of their local health departments.  3) Find a [facility] If your illness is not likely to be very severe or complicated, you may want to try a walk in  clinic. These are popping up all over the country in pharmacies, drugstores, and shopping centers. They're usually staffed by nurse practitioners or physician assistants that have been trained to treat common illnesses and complaints. They're usually fairly quick and inexpensive. However, if you have serious medical issues or chronic medical problems, these are probably not your best option.  No Primary Care Doctor: - Call Health Connect at  [phone] - they can help you locate a primary care doctor that  accepts your insurance, provides certain services, etc. - Physician Referral Service- [phone]  Chronic Pain Problems: Organization         Address  Phone   Notes  [facility]  [phone] Patients need to be referred by their primary care doctor.   Medication Assistance: Organization         Address  Phone   Notes  [facility] Medication [facility] [address] [place], [place] [address] [phone] --Must be a resident of [facility] -- Must have NO insurance coverage whatsoever (no Medicaid/ Medicare, etc.) -- The pt. MUST have a primary care doctor that directs their care regularly and follows them in the community   Med[facility]ist  [phone]   [employer]  [phone]    Agencies that provide inexpensive medical care: Organization         Address  Phone   Notes  [facility]  [phone]   [facility]    [phone]   [facility] [place], [place] [address] [phone]   [place] [address] [place] [phone]   Planned Parenthood    [phone]   [facility]    [phone]   [place] and [address], [place] Phone:  [phone], Fax:  [phone] Hours of Operation:  9 am - 6 pm, M-F.  Also accepts Medicaid/Medicare and self-pay.  [facility]  for [address], [address], [place] Phone: [phone], Fax: [phone]. Hours of Operation:  8:30 am - 5:30 pm, M-F.  Also accepts Medicaid and self-pay.  [facility] [place] [address], [place] Phone: [phone]   [place], [place], [place] [phone], Ext. [phone] Mondays & Thursdays: 7-9 AM.  First 15 patients are seen on a first come, first serve basis.    [place] Providers:  Organization         Address  Phone   Notes  [facility] [address], [place] [phone] Also accepts self-pay patients.  [facility] [address], [place]  [phone]   [place], [address], [place] [phone]   [facility] [address] [place] [phone]   Lucianne Lei [address], [place]   [phone] Only accepts [place] Access [place] patients after they have their name applied to their card.   Self-Pay (no insurance) in [facility]:  Organization         Address  Phone   Notes  Sickle Cell Patients, [facility] Internal Medicine [place] [phone]   [facility] [place] [phone]   [facility] [place]  [place], [place], [place] [phone]   [facility]/Dr. Osei-Bonsu  [address] [place] or [address], [place] [phone] Phone number for both [place] and [place] locations is the same.  [facility] [address], [place] [phone]   [facility] [address] [place] or [address] [phone]   [facility] [address] [place] [phone], phone; [phone], fax Sees patients  1st and 3rd Saturday of every month.  Must not qualify for public or private insurance (i.e. Medicaid, Medicare, [place] Health Choice, Veterans' Benefits)  Household income should be no more than 200% of the poverty level The clinic cannot treat you if you are pregnant or think you are pregnant  Sexually transmitted diseases are not treated at the clinic.    Dental Care: Organization         Address  Phone  Notes  [facility] Department of [facility] [place] [phone] Accepts children up to age 67 who are enrolled in [place] or [place]; pregnant women with a Medicaid card; and children who have applied for Medicaid or [place] Health Choice, but were declined, whose parents can pay a reduced fee at time of service.  [facility] Department of  [facility]  [address], [place] [phone] Accepts children up to age 73 who are enrolled in Medicaid or [place]; pregnant women with a Medicaid card; and children who have applied for Medicaid or  Health Choice, but were declined, whose parents can pay a reduced fee at time of service.  [place] Adult [facility] Access PROGRAM  [place] [phone] Patients are seen by appointment only. Walk-ins are not accepted. [place] will see patients 12 years of age and older. Monday - Tuesday (8am-5pm) Most Wednesdays (8:30-5pm) $30 per visit, cash only  [facility] Adult Dental Access PROGRAM  [address], [facility] [phone] Patients are seen by appointment only. Walk-ins are not accepted. [place] will see patients 70 years of age and older. One Wednesday Evening (Monthly: Volunteer Based).  $30 per visit, cash only  [place]  [phone] for adults; Children under age 65, call Graduate Pediatric Dentistry at [phone]. Children aged 37-14, please call [phone] to request a  pediatric application.  Dental services are provided in all areas of dental care including fillings, crowns and bridges, complete and partial dentures, implants, gum treatment, root canals, and extractions. Preventive care is also provided. Treatment is provided to both adults and children. Patients are selected via a lottery and there is often a waiting list.   [facility] [address], [place]  [phone] [email or web address]   [facility] [address] [place], [place] [phone], Ext. [phone] Second and Fourth Thursday of each month, opens at 6:30 AM; Clinic ends at 9 AM.  Patients are seen on a first-come first-served basis, and a limited number are seen during each clinic.   [facility]  [address] Hillard Danker [place], [place] [phone]   Eligibility Requirements You must have lived in [place], [place], or [place] counties for at least the last three months.   You cannot be eligible for state or federal sponsored [employer], including [employer], [place], or [employer].   You generally cannot be eligible for healthcare insurance through your employer.    How to apply: Eligibility screenings are held every Tuesday and Wednesday afternoon from 1:00 pm until 4:00 pm. You do not need an appointment for the interview!  [facility] [address], [place], [place]   [place]  [place] Department  [place]  [phone]    Behavioral Health Resources in the Community: Intensive Outpatient Programs Organization         Address  Phone  Notes  [place]. [address], [place], [place] [phone]   [facility] Outpatient [address] [place], [place]   ADS: Alcohol & Drug Svcs [address], [place], [place]   [place] [address]  [place], [place] or [phone]   Substance Abuse Resources Organization         Address  Phone  Notes  Alcohol and Drug Services  [phone]   [place]  [phone]   The [place]   Chinita Pester  [phone]   Residential & Outpatient Substance Abuse Program  [phone]   Psychological Services Organization         Address  Phone  Notes  [place]  [place]   [place] [address] [place] or [phone]    Mobile Crisis Teams Organization         Address  Phone  Notes  [facility], Mobile Crisis Care Unit  [phone]   [facility]  [address] [place], [place]   [place] Levels [address], [place] [phone]    Self-Help/Support Groups Organization         Address  Phone             Notes  [place]. of [place] - variety of support groups  [place] Call for more information  Narcotics Anonymous (NA), [facility] [address], [employer] [place]  2 meetings at this location   Special educational needs teacher         Address  Phone  Notes  [facility]idential [facility] [place],    [place]  [phone]   [facility]  [address] [place] [address], [place], [place]   [place], [place] [phone] Admissions: 8am-3pm M-F  Incentives Substance [place] [address],    [place], [place] [phone]   [facility] [address] [place], [place], [place]   [facility] [address],  [place], [place]   [facility] - Intensive Outpatient [address], [place], [place]   [facility] ([place].,    [place], [place] [phone] or [phone]   [facility] (RTS) [address] [place], [place] Accepts Medicaid  [facility] [place] [facility] [address]  [place] [phone] Substance Abuse/Addiction Treatment   [facility] Organization         Address  Phone  Notes  [facility]  [phone]   Domenic Schwab, PhD [address] Arlis Porta [place], [place]   [phone] or [phone]   [place], [place] [phone]   [facility] [address] [place], [place] [phone] Insurance/Medicaid/sponsorship through [facility] and Families [address], [address] [place]                                    [place], [place] [phone] [place] [address], [place] [phone]    Dr. Adele Schilder  [phone]   [facility] [place] [facility] Dept. 1) [address], [place] 2) [place] 3)  [place], [place] [phone]  [phone]   [place] [phone] or [phone] (After Hours)

## 2014-05-11 NOTE — ED Notes (Signed)
Pt complains of left eye ear irritation/redness for a few days and right ear pain that woke the patient up this morning.

## 2016-01-10 DIAGNOSIS — R300 Dysuria: Secondary | ICD-10-CM | POA: Diagnosis not present

## 2016-01-15 ENCOUNTER — Telehealth: Payer: Self-pay | Admitting: Nurse Practitioner

## 2016-01-15 ENCOUNTER — Telehealth: Payer: 59 | Admitting: Nurse Practitioner

## 2016-01-15 DIAGNOSIS — R6889 Other general symptoms and signs: Secondary | ICD-10-CM | POA: Diagnosis not present

## 2016-01-15 MED ORDER — PREDNISONE 20 MG PO TABS: 40.0000 mg | ORAL_TABLET | Freq: Every day | ORAL | 0 refills | Status: AC

## 2016-01-15 MED ORDER — OSELTAMIVIR PHOSPHATE 75 MG PO CAPS: 75.0000 mg | ORAL_CAPSULE | Freq: Every day | ORAL | 0 refills | Status: DC

## 2016-01-15 NOTE — Progress Notes (Signed)

## 2016-01-15 NOTE — Telephone Encounter (Signed)
Called patient to inform that [facility] had faxed a rejection message regarding the prescriptions prescribed for her e-visit.  Informed patient rejection message indicated [facility] had [employer] and medicaid on file, both which rejected the claims for her medication.  Patient informed she now has UMR and will contact [facility] with the updated insurance information.

## 2016-03-13 ENCOUNTER — Other Ambulatory Visit (INDEPENDENT_AMBULATORY_CARE_PROVIDER_SITE_OTHER): Payer: 59 | Admitting: Family Medicine

## 2016-03-13 DIAGNOSIS — J45909 Unspecified asthma, uncomplicated: Secondary | ICD-10-CM

## 2016-03-13 MED ORDER — IPRATROPIUM-ALBUTEROL 0.5-2.5 (3) MG/3ML IN SOLN
3.0000 mL | Freq: Once | RESPIRATORY_TRACT | Status: AC
  Administered 2016-03-13: 3 mL via RESPIRATORY_TRACT

## 2016-03-13 MED ORDER — ALBUTEROL SULFATE (2.5 MG/3ML) 0.083% IN NEBU
2.5000 mg | INHALATION_SOLUTION | Freq: Once | RESPIRATORY_TRACT | Status: AC
  Administered 2016-03-13: 2.5 mg via RESPIRATORY_TRACT

## 2016-03-13 MED ORDER — PREDNISONE 20 MG PO TABS: ORAL_TABLET | ORAL | 0 refills | Status: DC

## 2016-03-13 NOTE — Addendum Note (Signed)
Addended by: Londell Moh T on: 03/13/2016 09:03 AM   Modules accepted: Orders
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
## 2016-04-25 ENCOUNTER — Encounter (INDEPENDENT_AMBULATORY_CARE_PROVIDER_SITE_OTHER): Payer: Self-pay | Admitting: Physician Assistant

## 2016-04-25 ENCOUNTER — Ambulatory Visit (INDEPENDENT_AMBULATORY_CARE_PROVIDER_SITE_OTHER): Payer: 59 | Admitting: Physician Assistant

## 2016-04-25 VITALS — BP 112/78 | HR 78 | Temp 97.6°F | Ht 64.0 in

## 2016-04-25 DIAGNOSIS — K439 Ventral hernia without obstruction or gangrene: Secondary | ICD-10-CM

## 2016-04-25 DIAGNOSIS — R7989 Other specified abnormal findings of blood chemistry: Secondary | ICD-10-CM

## 2016-04-25 DIAGNOSIS — R946 Abnormal results of thyroid function studies: Secondary | ICD-10-CM

## 2016-04-25 NOTE — Patient Instructions (Signed)
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
## 2016-04-26 ENCOUNTER — Ambulatory Visit (HOSPITAL_COMMUNITY)
Admission: RE | Admit: 2016-04-26 | Discharge: 2016-04-26 | Disposition: A | Discharge status: Home / Self Care | Payer: 59 | Source: Ambulatory Visit | Attending: Physician Assistant | Admitting: Physician Assistant

## 2016-04-26 ENCOUNTER — Other Ambulatory Visit (INDEPENDENT_AMBULATORY_CARE_PROVIDER_SITE_OTHER): Payer: Self-pay | Admitting: Physician Assistant

## 2016-04-26 DIAGNOSIS — K439 Ventral hernia without obstruction or gangrene: Secondary | ICD-10-CM | POA: Insufficient documentation

## 2016-04-26 DIAGNOSIS — R1013 Epigastric pain: Principal | ICD-10-CM

## 2016-04-26 DIAGNOSIS — G8929 Other chronic pain: Secondary | ICD-10-CM

## 2016-04-26 DIAGNOSIS — R109 Unspecified abdominal pain: Secondary | ICD-10-CM | POA: Diagnosis not present

## 2016-04-26 LAB — CBC WITH DIFFERENTIAL/PLATELET
BASOS: 0 %
Basophils Absolute: 0 10*3/uL (ref 0.0–0.2)
EOS (ABSOLUTE): 0.2 10*3/uL (ref 0.0–0.4)
Eos: 2 %
HEMOGLOBIN: 14.7 g/dL (ref 11.1–15.9)
Hematocrit: 43.2 % (ref 34.0–46.6)
Immature Grans (Abs): 0 10*3/uL (ref 0.0–0.1)
Immature Granulocytes: 0 %
LYMPHS ABS: 2.4 10*3/uL (ref 0.7–3.1)
Lymphs: 29 %
MCH: 30.9 pg (ref 26.6–33.0)
MCHC: 34 g/dL (ref 31.5–35.7)
MCV: 91 fL (ref 79–97)
Monocytes Absolute: 0.6 10*3/uL (ref 0.1–0.9)
Monocytes: 7 %
NEUTROS ABS: 4.9 10*3/uL (ref 1.4–7.0)
Neutrophils: 62 %
PLATELETS: 298 10*3/uL (ref 150–379)
RBC: 4.76 x10E6/uL (ref 3.77–5.28)
RDW: 13.3 % (ref 12.3–15.4)
WBC: 8.1 10*3/uL (ref 3.4–10.8)

## 2016-04-26 LAB — COMPREHENSIVE METABOLIC PANEL
A/G RATIO: 1.8 (ref 1.2–2.2)
ALBUMIN: 4.7 g/dL (ref 3.5–5.5)
ALK PHOS: 55 IU/L (ref 39–117)
ALT: 15 IU/L (ref 0–32)
AST: 16 IU/L (ref 0–40)
BUN/Creatinine Ratio: 13 (ref 9–23)
BUN: 9 mg/dL (ref 6–20)
Bilirubin Total: 0.5 mg/dL (ref 0.0–1.2)
CO2: 21 mmol/L (ref 18–29)
Calcium: 9.4 mg/dL (ref 8.7–10.2)
Chloride: 101 mmol/L (ref 96–106)
Creatinine, Ser: 0.72 mg/dL (ref 0.57–1.00)
GFR calc Af Amer: 132 mL/min/{1.73_m2} (ref >59)
GFR calc non Af Amer: 114 mL/min/{1.73_m2} (ref >59)
Globulin, Total: 2.6 g/dL (ref 1.5–4.5)
Glucose: 79 mg/dL (ref 65–99)
POTASSIUM: 4.5 mmol/L (ref 3.5–5.2)
Sodium: 139 mmol/L (ref 134–144)
Total Protein: 7.3 g/dL (ref 6.0–8.5)

## 2016-04-26 LAB — TSH: TSH: 0.678 u[IU]/mL (ref 0.450–4.500)

## 2016-04-26 NOTE — Addendum Note (Signed)
Addended by: Domenica Fail D on: 04/26/2016 08:45 AM   Modules accepted: Orders

## 2016-04-26 NOTE — Progress Notes (Signed)
CT scheduled for Friday March 16th. 4 hours NPO. Pick up Contrast 24 hours prior. Drink contrast at 1300hrs and 1400hrs on imaging day.

## 2016-05-03 ENCOUNTER — Ambulatory Visit (HOSPITAL_COMMUNITY)
Admission: RE | Admit: 2016-05-03 | Discharge: 2016-05-03 | Disposition: A | Discharge status: Home / Self Care | Payer: 59 | Source: Ambulatory Visit | Attending: Physician Assistant | Admitting: Physician Assistant

## 2016-05-03 ENCOUNTER — Ambulatory Visit (HOSPITAL_COMMUNITY): Payer: 59

## 2016-05-03 DIAGNOSIS — M795 Residual foreign body in soft tissue: Secondary | ICD-10-CM | POA: Diagnosis not present

## 2016-05-03 DIAGNOSIS — K8020 Calculus of gallbladder without cholecystitis without obstruction: Secondary | ICD-10-CM | POA: Insufficient documentation

## 2016-05-03 DIAGNOSIS — R1013 Epigastric pain: Secondary | ICD-10-CM | POA: Insufficient documentation

## 2016-05-03 DIAGNOSIS — G8929 Other chronic pain: Secondary | ICD-10-CM | POA: Diagnosis not present

## 2016-05-03 DIAGNOSIS — K439 Ventral hernia without obstruction or gangrene: Secondary | ICD-10-CM

## 2016-05-03 DIAGNOSIS — K429 Umbilical hernia without obstruction or gangrene: Secondary | ICD-10-CM | POA: Insufficient documentation

## 2016-05-03 MED ORDER — IOPAMIDOL (ISOVUE-300) INJECTION 61%
INTRAVENOUS | Status: AC
  Filled 2016-05-03: qty 100

## 2016-05-06 ENCOUNTER — Other Ambulatory Visit (INDEPENDENT_AMBULATORY_CARE_PROVIDER_SITE_OTHER): Payer: Self-pay | Admitting: Physician Assistant

## 2016-05-06 DIAGNOSIS — K458 Other specified abdominal hernia without obstruction or gangrene: Secondary | ICD-10-CM

## 2016-05-06 NOTE — Progress Notes (Signed)
Abdominal hernia by CT w/contrast.

## 2016-05-21 ENCOUNTER — Encounter (INDEPENDENT_AMBULATORY_CARE_PROVIDER_SITE_OTHER): Payer: Self-pay | Admitting: Physician Assistant

## 2016-05-21 ENCOUNTER — Ambulatory Visit (INDEPENDENT_AMBULATORY_CARE_PROVIDER_SITE_OTHER): Payer: 59 | Admitting: Physician Assistant

## 2016-05-21 VITALS — BP 128/87 | HR 78 | Temp 97.9°F | Ht 64.0 in | Wt 225.0 lb

## 2016-05-21 DIAGNOSIS — R110 Nausea: Secondary | ICD-10-CM

## 2016-05-21 DIAGNOSIS — N3000 Acute cystitis without hematuria: Secondary | ICD-10-CM

## 2016-05-21 DIAGNOSIS — R1030 Lower abdominal pain, unspecified: Secondary | ICD-10-CM | POA: Diagnosis not present

## 2016-05-21 LAB — POCT URINE PREGNANCY: PREG TEST UR: NEGATIVE

## 2016-05-21 LAB — POCT URINALYSIS DIPSTICK
Bilirubin, UA: NEGATIVE
Blood, UA: NEGATIVE
Glucose, UA: NEGATIVE
KETONES UA: 15
Nitrite, UA: POSITIVE
Protein, UA: NEGATIVE
Spec Grav, UA: 1.02 (ref 1.030–1.035)
Urobilinogen, UA: 0.2 (ref ?–2.0)
pH, UA: 6 (ref 5.0–8.0)

## 2016-05-21 MED ORDER — CIPROFLOXACIN HCL 500 MG PO TABS: 500.0000 mg | ORAL_TABLET | Freq: Two times a day (BID) | ORAL | 0 refills | Status: AC
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
## 2016-05-21 NOTE — Patient Instructions (Signed)

## 2016-05-23 LAB — URINE CULTURE

## 2016-05-31 ENCOUNTER — Ambulatory Visit: Payer: Self-pay | Admitting: General Surgery

## 2016-05-31 DIAGNOSIS — K432 Incisional hernia without obstruction or gangrene: Secondary | ICD-10-CM | POA: Diagnosis not present

## 2016-05-31 DIAGNOSIS — K8020 Calculus of gallbladder without cholecystitis without obstruction: Secondary | ICD-10-CM | POA: Diagnosis not present

## 2016-06-21 ENCOUNTER — Telehealth: Payer: 59 | Admitting: Family

## 2016-06-21 DIAGNOSIS — N390 Urinary tract infection, site not specified: Secondary | ICD-10-CM | POA: Diagnosis not present

## 2016-06-21 DIAGNOSIS — R319 Hematuria, unspecified: Secondary | ICD-10-CM

## 2016-06-21 MED ORDER — NITROFURANTOIN MONOHYD MACRO 100 MG PO CAPS: 100.0000 mg | ORAL_CAPSULE | Freq: Two times a day (BID) | ORAL | 0 refills | Status: DC

## 2016-06-21 NOTE — Progress Notes (Signed)

## 2016-07-22 ENCOUNTER — Ambulatory Visit (INDEPENDENT_AMBULATORY_CARE_PROVIDER_SITE_OTHER): Payer: 59 | Admitting: Physician Assistant

## 2016-07-22 ENCOUNTER — Encounter (INDEPENDENT_AMBULATORY_CARE_PROVIDER_SITE_OTHER): Payer: Self-pay | Admitting: Physician Assistant

## 2016-07-22 VITALS — BP 132/85 | HR 77 | Temp 98.0°F | Resp 16 | Ht 64.0 in | Wt 223.0 lb

## 2016-07-22 DIAGNOSIS — J069 Acute upper respiratory infection, unspecified: Secondary | ICD-10-CM | POA: Diagnosis not present

## 2016-07-22 MED ORDER — SALINE SPRAY 0.65 % NA SOLN: 1.0000 | NASAL | 0 refills | Status: DC | PRN

## 2016-07-22 MED ORDER — BENZONATATE 100 MG PO CAPS: 100.0000 mg | ORAL_CAPSULE | Freq: Three times a day (TID) | ORAL | 0 refills | Status: DC | PRN

## 2016-07-22 MED ORDER — GUAIFENESIN ER 1200 MG PO TB12: 1.0000 | ORAL_TABLET | Freq: Two times a day (BID) | ORAL | 0 refills | Status: AC

## 2016-07-22 MED ORDER — NAPROXEN 500 MG PO TBEC: 500.0000 mg | DELAYED_RELEASE_TABLET | Freq: Two times a day (BID) | ORAL | 0 refills | Status: DC

## 2016-07-22 NOTE — Progress Notes (Signed)
Subjective:  Patient ID: Karen Jordan, female    DOB: Aug 06, 1985  Age: 31 y.o. MRN: [id number]  CC: nasal congestion  HPI Karen Jordan is a 31 y.o. female with a PMH of asthma presents acutely with nasal congestion and cough since 2-3 days ago. Cough worse at night. There is also associated warmth, chills, malaise, loose stools, and anorexia. Has not taken temperature. Took some OTC cold remedy which helped relieve some congestion. Has been drinking "a lot" of orange juice. Thinks one of her children is beginning to have similar symptoms. Does not endorse any other symptoms.      ROS Review of Systems  Constitutional: Positive for malaise/fatigue. Negative for chills and fever.  Eyes: Negative for blurred vision.  Respiratory: Positive for cough. Negative for shortness of breath.   Cardiovascular: Positive for chest pain (chest pressure). Negative for palpitations.  Gastrointestinal: Positive for diarrhea. Negative for abdominal pain and nausea.  Genitourinary: Negative for dysuria and hematuria.  Musculoskeletal: Negative for joint pain and myalgias.  Skin: Negative for rash.  Neurological: Negative for tingling and headaches.  Psychiatric/Behavioral: Negative for depression. The patient is not nervous/anxious.     Objective:  BP 132/85 (BP Location: Right Arm, Patient Position: Sitting, Cuff Size: Normal)   Pulse 77   Temp 98 F (36.7 C) (Oral)   Resp 16   Ht 5\' 4"  (1.626 m)   Wt 223 lb (101.2 kg)   SpO2 98%   BMI 38.28 kg/m   BP/Weight 07/22/2016 07/21/9526 05/20/3242  Systolic BP 010 272 536  Diastolic BP 85 87 78  Wt. (Lbs) 223 225 -  BMI 38.28 38.62 -      Physical Exam  Constitutional: She is oriented to person, place, and time.  Well developed, well nourished, NAD, polite  HENT:  Head: Normocephalic and atraumatic.  TM mildly erythematous bilaterally. Oropharynx mildly erythematous without exudates. Tonsils 1+. Turbinates hypertrophic with clear  rhinorrhea.  Eyes: No scleral icterus.  Neck: Normal range of motion. Neck supple.  Cardiovascular: Normal rate, regular rhythm and normal heart sounds.  Exam reveals no gallop and no friction rub.   No murmur heard. Pulmonary/Chest: Effort normal and breath sounds normal. No respiratory distress. She has no wheezes. She has no rales.  Lymphadenopathy:    She has no cervical adenopathy.  Neurological: She is alert and oriented to person, place, and time.  Skin: Skin is warm and dry. No rash noted. No erythema. No pallor.  Psychiatric: She has a normal mood and affect. Her behavior is normal. Thought content normal.  Vitals reviewed.    Assessment & Plan:   1. Acute upper respiratory infection - Begin Guaifenesin (MUCINEX MAXIMUM STRENGTH) 1200 MG TB12; Take 1 tablet (1,200 mg total) by mouth 2 (two) times daily.  Dispense: 10 tablet; Refill: 0 - Begin naproxen (EC NAPROSYN) 500 MG EC tablet; Take 1 tablet (500 mg total) by mouth 2 (two) times daily with a meal.  Dispense: 14 tablet; Refill: 0 - Begin sodium chloride (OCEAN) 0.65 % SOLN nasal spray; Place 1 spray into both nostrils as needed for congestion.  Dispense: 1 Bottle; Refill: 0   Meds ordered this encounter  Medications  . Guaifenesin (MUCINEX MAXIMUM STRENGTH) 1200 MG TB12    Sig: Take 1 tablet (1,200 mg total) by mouth 2 (two) times daily.    Dispense:  10 tablet    Refill:  0    Order Specific Question:   Supervising Provider    Answer:  JEGEDE, OLUGBEMIGA E [id number]  . naproxen (EC NAPROSYN) 500 MG EC tablet    Sig: Take 1 tablet (500 mg total) by mouth 2 (two) times daily with a meal.    Dispense:  14 tablet    Refill:  0    Order Specific Question:   Supervising Provider    Answer:   Tresa Garter [id number]  . sodium chloride (OCEAN) 0.65 % SOLN nasal spray    Sig: Place 1 spray into both nostrils as needed for congestion.    Dispense:  1 Bottle    Refill:  0    Order Specific Question:   Supervising  Provider    Answer:   Tresa Garter [id number]    Follow-up: Return if symptoms worsen or fail to improve.   Clent Demark PA

## 2016-07-22 NOTE — Patient Instructions (Signed)
Upper Respiratory Infection, Adult Most upper respiratory infections (URIs) are a viral infection of the air passages leading to the lungs. A URI affects the nose, throat, and upper air passages. The most common type of URI is nasopharyngitis and is typically referred to as "the common cold." URIs run their course and usually go away on their own. Most of the time, a URI does not require medical attention, but sometimes a bacterial infection in the upper airways can follow a viral infection. This is called a secondary infection. Sinus and middle ear infections are common types of secondary upper respiratory infections. Bacterial pneumonia can also complicate a URI. A URI can worsen asthma and chronic obstructive pulmonary disease (COPD). Sometimes, these complications can require emergency medical care and may be life threatening. What are the causes? Almost all URIs are caused by viruses. A virus is a type of germ and can spread from one person to another. What increases the risk? You may be at risk for a URI if:  You smoke.  You have chronic heart or lung disease.  You have a weakened defense (immune) system.  You are very young or very old.  You have nasal allergies or asthma.  You work in crowded or poorly ventilated areas.  You work in health care facilities or schools.  What are the signs or symptoms? Symptoms typically develop 2-3 days after you come in contact with a cold virus. Most viral URIs last 7-10 days. However, viral URIs from the influenza virus (flu virus) can last 14-18 days and are typically more severe. Symptoms may include:  Runny or stuffy (congested) nose.  Sneezing.  Cough.  Sore throat.  Headache.  Fatigue.  Fever.  Loss of appetite.  Pain in your forehead, behind your eyes, and over your cheekbones (sinus pain).  Muscle aches.  How is this diagnosed? Your health care provider may diagnose a URI by:  Physical exam.  Tests to check that your  symptoms are not due to another condition such as: ? Strep throat. ? Sinusitis. ? Pneumonia. ? Asthma.  How is this treated? A URI goes away on its own with time. It cannot be cured with medicines, but medicines may be prescribed or recommended to relieve symptoms. Medicines may help:  Reduce your fever.  Reduce your cough.  Relieve nasal congestion.  Follow these instructions at home:  Take medicines only as directed by your health care provider.  Gargle warm saltwater or take cough drops to comfort your throat as directed by your health care provider.  Use a warm mist humidifier or inhale steam from a shower to increase air moisture. This may make it easier to breathe.  Drink enough fluid to keep your urine clear or pale yellow.  Eat soups and other clear broths and maintain good nutrition.  Rest as needed.  Return to work when your temperature has returned to normal or as your health care provider advises. You may need to stay home longer to avoid infecting others. You can also use a face mask and careful hand washing to prevent spread of the virus.  Increase the usage of your inhaler if you have asthma.  Do not use any tobacco products, including cigarettes, chewing tobacco, or electronic cigarettes. If you need help quitting, ask your health care provider. How is this prevented? The best way to protect yourself from getting a cold is to practice good hygiene.  Avoid oral or hand contact with people with cold symptoms.  Wash your   hands often if contact occurs.  There is no clear evidence that vitamin C, vitamin E, echinacea, or exercise reduces the chance of developing a cold. However, it is always recommended to get plenty of rest, exercise, and practice good nutrition. Contact a health care provider if:  You are getting worse rather than better.  Your symptoms are not controlled by medicine.  You have chills.  You have worsening shortness of breath.  You have  brown or red mucus.  You have yellow or brown nasal discharge.  You have pain in your face, especially when you bend forward.  You have a fever.  You have swollen neck glands.  You have pain while swallowing.  You have white areas in the back of your throat. Get help right away if:  You have severe or persistent: ? Headache. ? Ear pain. ? Sinus pain. ? Chest pain.  You have chronic lung disease and any of the following: ? Wheezing. ? Prolonged cough. ? Coughing up blood. ? A change in your usual mucus.  You have a stiff neck.  You have changes in your: ? Vision. ? Hearing. ? Thinking. ? Mood. This information is not intended to replace advice given to you by your health care provider. Make sure you discuss any questions you have with your health care provider. Document Released: 07/31/2000 Document Revised: 10/08/2015 Document Reviewed: 05/12/2013 Elsevier Interactive Patient Education  2017 [employer]vier Inc.  

## 2016-07-23 ENCOUNTER — Other Ambulatory Visit (INDEPENDENT_AMBULATORY_CARE_PROVIDER_SITE_OTHER): Payer: Self-pay | Admitting: Physician Assistant

## 2016-07-23 DIAGNOSIS — R0602 Shortness of breath: Secondary | ICD-10-CM

## 2016-07-23 NOTE — Progress Notes (Unsigned)
Pt complained of SOB and chest pressure.

## 2016-10-22 ENCOUNTER — Telehealth: Payer: 59 | Admitting: Nurse Practitioner

## 2016-10-22 ENCOUNTER — Encounter (HOSPITAL_COMMUNITY): Payer: Self-pay | Admitting: Emergency Medicine

## 2016-10-22 ENCOUNTER — Ambulatory Visit (HOSPITAL_COMMUNITY)
Admission: EM | Admit: 2016-10-22 | Discharge: 2016-10-22 | Disposition: A | Discharge status: Home / Self Care | Payer: 59 | Attending: Family Medicine | Admitting: Family Medicine

## 2016-10-22 DIAGNOSIS — R05 Cough: Secondary | ICD-10-CM

## 2016-10-22 DIAGNOSIS — J00 Acute nasopharyngitis [common cold]: Secondary | ICD-10-CM | POA: Diagnosis not present

## 2016-10-22 DIAGNOSIS — R059 Cough, unspecified: Secondary | ICD-10-CM

## 2016-10-22 DIAGNOSIS — J069 Acute upper respiratory infection, unspecified: Secondary | ICD-10-CM

## 2016-10-22 DIAGNOSIS — R079 Chest pain, unspecified: Secondary | ICD-10-CM

## 2016-10-22 DIAGNOSIS — R0602 Shortness of breath: Secondary | ICD-10-CM

## 2016-10-22 MED ORDER — ALBUTEROL SULFATE (2.5 MG/3ML) 0.083% IN NEBU: 2.5000 mg | INHALATION_SOLUTION | RESPIRATORY_TRACT | 0 refills | Status: DC | PRN

## 2016-10-22 MED ORDER — ALBUTEROL SULFATE HFA 108 (90 BASE) MCG/ACT IN AERS: 2.0000 | INHALATION_SPRAY | Freq: Four times a day (QID) | RESPIRATORY_TRACT | 0 refills | Status: DC | PRN

## 2016-10-22 MED ORDER — FLUTICASONE PROPIONATE 50 MCG/ACT NA SUSP: 2.0000 | Freq: Every day | NASAL | 0 refills | Status: DC

## 2016-10-22 MED ORDER — BENZONATATE 100 MG PO CAPS: 100.0000 mg | ORAL_CAPSULE | Freq: Three times a day (TID) | ORAL | 0 refills | Status: DC | PRN

## 2016-10-22 NOTE — ED Triage Notes (Signed)
Symptoms started Saturday, gradually have worsened since onset.  Runny nose, congestion, intermittent sore throat.  Chest soreness, increases with coughing, tightness in chest with cough

## 2016-10-22 NOTE — Discharge Instructions (Signed)
Tessalon for cough. Start flonase for nasal congestion. Albuterol refilled for wheezing. Continue sudafed for congestion. You can use over the counter nasal saline rinse such as neti pot for nasal congestion. Keep hydrated, your urine should be clear to pale yellow in color. Tylenol/motrin for fever and pain. Monitor for any worsening of symptoms, chest pain, shortness of breath, wheezing, swelling of the throat, follow up for reevaluation.
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
## 2016-10-22 NOTE — Progress Notes (Signed)
Based on what you shared with me it looks like you have a serious condition that should be evaluated in a face to face office visit.  NOTE: Even if you have entered your credit card information for this eVisit, you will not be charged.   The SOB and chest pain are a concern and really need face to face visit  If you are having a true medical emergency please call 911.  If you need an urgent face to face visit, [facility] has four urgent care centers for your convenience.  If you need care fast and have a high deductible or no insurance consider:   [email or web address]  [phone]  [address] [place], [place] [address] 8 am to 8 pm Monday-Friday 10 am to 4 pm Saturday-Sunday   The following sites will take your  insurance:    . [facility] [place] a Provider at this Location  [address] [place], [place] [address] . 10 am to 8 pm Monday-Friday . 12 pm to 8 pm Saturday-Sunday   . [facility] at [place] a Provider at this Location  [address] [place], [place], [place] [address] . 8 am to 8 pm Monday-Friday . 9 am to 6 pm Saturday . 11 am to 6 pm Sunday   . [facility] at [place] Get Driving Directions  [address] [place], [place] [address] . 8 am to 8 pm Monday-Friday . 8 am to 4 pm Saturday-Sunday   Your e-visit answers were reviewed by a board certified advanced clinical practitioner to complete your personal care plan.  Thank you for using e-Visits.

## 2017-02-19 DIAGNOSIS — K432 Incisional hernia without obstruction or gangrene: Secondary | ICD-10-CM | POA: Diagnosis not present

## 2017-02-27 ENCOUNTER — Ambulatory Visit: Payer: Self-pay | Admitting: General Surgery

## 2017-03-07 ENCOUNTER — Encounter: Payer: Self-pay | Admitting: Nurse Practitioner

## 2017-03-07 ENCOUNTER — Ambulatory Visit: Payer: 59 | Attending: Nurse Practitioner | Admitting: Nurse Practitioner

## 2017-03-07 VITALS — BP 113/72 | HR 95 | Temp 98.1°F | Resp 14 | Ht 65.0 in | Wt 229.0 lb

## 2017-03-07 DIAGNOSIS — E669 Obesity, unspecified: Secondary | ICD-10-CM | POA: Diagnosis not present

## 2017-03-07 DIAGNOSIS — J40 Bronchitis, not specified as acute or chronic: Secondary | ICD-10-CM | POA: Diagnosis not present

## 2017-03-07 DIAGNOSIS — Z79899 Other long term (current) drug therapy: Secondary | ICD-10-CM | POA: Diagnosis not present

## 2017-03-07 DIAGNOSIS — F17200 Nicotine dependence, unspecified, uncomplicated: Secondary | ICD-10-CM | POA: Diagnosis not present

## 2017-03-07 DIAGNOSIS — I10 Essential (primary) hypertension: Secondary | ICD-10-CM | POA: Diagnosis not present

## 2017-03-07 DIAGNOSIS — J45909 Unspecified asthma, uncomplicated: Secondary | ICD-10-CM | POA: Insufficient documentation

## 2017-03-07 DIAGNOSIS — Z716 Tobacco abuse counseling: Secondary | ICD-10-CM | POA: Insufficient documentation

## 2017-03-07 DIAGNOSIS — Z880 Allergy status to penicillin: Secondary | ICD-10-CM | POA: Insufficient documentation

## 2017-03-07 DIAGNOSIS — F17210 Nicotine dependence, cigarettes, uncomplicated: Secondary | ICD-10-CM | POA: Insufficient documentation

## 2017-03-07 DIAGNOSIS — D649 Anemia, unspecified: Secondary | ICD-10-CM | POA: Insufficient documentation

## 2017-03-07 DIAGNOSIS — J4520 Mild intermittent asthma, uncomplicated: Secondary | ICD-10-CM | POA: Diagnosis not present

## 2017-03-07 DIAGNOSIS — Z6838 Body mass index (BMI) 38.0-38.9, adult: Secondary | ICD-10-CM | POA: Diagnosis not present

## 2017-03-07 DIAGNOSIS — D500 Iron deficiency anemia secondary to blood loss (chronic): Secondary | ICD-10-CM | POA: Insufficient documentation

## 2017-03-07 MED ORDER — AZITHROMYCIN 250 MG PO TABS: ORAL_TABLET | ORAL | 0 refills | Status: DC

## 2017-03-07 MED ORDER — PHENTERMINE HCL 37.5 MG PO TABS: 37.5000 mg | ORAL_TABLET | Freq: Every day | ORAL | 0 refills | Status: DC

## 2017-03-07 MED ORDER — ALBUTEROL SULFATE HFA 108 (90 BASE) MCG/ACT IN AERS: 2.0000 | INHALATION_SPRAY | Freq: Four times a day (QID) | RESPIRATORY_TRACT | 0 refills | Status: DC | PRN

## 2017-03-07 MED ORDER — VARENICLINE TARTRATE 0.5 MG X 11 & 1 MG X 42 PO MISC: ORAL | 0 refills | Status: DC

## 2017-03-07 MED ORDER — FLUTICASONE PROPIONATE 50 MCG/ACT NA SUSP: 2.0000 | Freq: Every day | NASAL | 0 refills | Status: DC

## 2017-03-07 MED ORDER — PREDNISONE 20 MG PO TABS: 40.0000 mg | ORAL_TABLET | Freq: Every day | ORAL | 0 refills | Status: AC

## 2017-03-07 NOTE — Patient Instructions (Addendum)
Preventing Unhealthy Weight Gain, Adult Staying at a healthy weight is important. When fat builds up in your body, you may become overweight or obese. These conditions put you at greater risk for developing certain health problems, such as heart disease, diabetes, sleeping problems, joint problems, and some cancers. Unhealthy weight gain is often the result of making unhealthy choices in what you eat. It is also a result of not getting enough exercise. You can make changes to your lifestyle to prevent obesity and stay as healthy as possible. What nutrition changes can be made? To maintain a healthy weight and prevent obesity:  Eat only as much as your body needs. To do this: ? Pay attention to signs that you are hungry or full. Stop eating as soon as you feel full. ? If you feel hungry, try drinking water first. Drink enough water so your urine is clear or pale yellow. ? Eat smaller portions. ? Look at serving sizes on food labels. Most foods contain more than one serving per container. ? Eat the recommended amount of calories for your gender and activity level. While most active people should eat around 2,000 calories per day, if you are trying to lose weight or are not very active, you main need to eat less calories. Talk to your health care provider or dietitian about how many calories you should eat each day.  Choose healthy foods, such as: ? Fruits and vegetables. Try to fill at least half of your plate at each meal with fruits and vegetables. ? Whole grains, such as whole wheat bread, brown rice, and quinoa. ? Lean meats, such as chicken or fish. ? Other healthy proteins, such as beans, eggs, or tofu. ? Healthy fats, such as nuts, seeds, fatty fish, and olive oil. ? Low-fat or fat-free dairy.  Check food labels and avoid food and drinks that: ? Are high in calories. ? Have added sugar. ? Are high in sodium. ? Have saturated fats or trans fats.  Limit how much you eat of the following  foods: ? Prepackaged meals. ? Fast food. ? Fried foods. ? Processed meat, such as bacon, sausage, and deli meats. ? Fatty cuts of red meat and poultry with skin.  Cook foods in healthier ways, such as by baking, broiling, or grilling.  When grocery shopping, try to shop around the outside of the store. This helps you buy mostly fresh foods and avoid canned and prepackaged foods.  What lifestyle changes can be made?  Exercise at least 30 minutes 5 or more days each week. Exercising includes brisk walking, yard work, biking, running, swimming, and team sports like basketball and soccer. Ask your health care provider which exercises are safe for you.  Do not use any products that contain nicotine or tobacco, such as cigarettes and e-cigarettes. If you need help quitting, ask your health care provider.  Limit alcohol intake to no more than 1 drink a day for nonpregnant women and 2 drinks a day for men. One drink equals 12 oz of beer, 5 oz of wine, or 1 oz of hard liquor.  Try to get 7-9 hours of sleep each night. What other changes can be made?  Keep a food and activity journal to keep track of: ? What you ate and how many calories you had. Remember to count sauces, dressings, and side dishes. ? Whether you were active, and what exercises you did. ? Your calorie, weight, and activity goals.  Check your weight regularly. Track any changes.   If you notice you have gained weight, make changes to your diet or activity routine.  Avoid taking weight-loss medicines or supplements. Talk to your health care provider before starting any new medicine or supplement.  Talk to your health care provider before trying any new diet or exercise plan. Why are these changes important? Eating healthy, staying active, and having healthy habits not only help prevent obesity, they also:  Help you to manage stress and emotions.  Help you to connect with friends and family.  Improve your  self-esteem.  Improve your sleep.  Prevent long-term health problems.  What can happen if changes are not made? Being obese or overweight can cause you to develop joint or bone problems, which can make it hard for you to stay active or do activities you enjoy. Being obese or overweight also puts stress on your heart and lungs and can lead to health problems like diabetes, heart disease, and some cancers. Where to find more information: Talk with your health care provider or a dietitian about healthy eating and healthy lifestyle choices. You may also find other information through these resources:  [place] Department of Agriculture MyPlate: FormerBoss.no  American Heart Association: www.heart.org  Centers for Disease Control and Prevention: http://www.wolf.info/  Summary  Staying at a healthy weight is important. It helps prevent certain diseases and health problems, such as heart disease, diabetes, joint problems, sleep disorders, and some cancers.  Being obese or overweight can cause you to develop joint or bone problems, which can make it hard for you to stay active or do activities you enjoy.  You can prevent unhealthy weight gain by eating a healthy diet, exercising regularly, not smoking, limiting alcohol, and getting enough sleep.  Talk with your health care provider or a dietitian for guidance about healthy eating and healthy lifestyle choices. This information is not intended to replace advice given to you by your health care provider. Make sure you discuss any questions you have with your health care provider. Document Released: 02/06/2016 Document Revised: 03/13/2016 Document Reviewed: 03/13/2016 Elsevier Interactive Patient Education  2018 [place] [employer] Calories are units of energy. Your body needs a certain amount of calories from food to keep you going throughout the day. When you eat more calories than your body needs, your body stores the  extra calories as fat. When you eat fewer calories than your body needs, your body burns fat to get the energy it needs. Calorie counting means keeping track of how many calories you eat and drink each day. Calorie counting can be helpful if you need to lose weight. If you make sure to eat fewer calories than your body needs, you should lose weight. Ask your health care provider what a healthy weight is for you. For calorie counting to work, you will need to eat the right number of calories in a day in order to lose a healthy amount of weight per week. A dietitian can help you determine how many calories you need in a day and will give you suggestions on how to reach your calorie goal.  A healthy amount of weight to lose per week is usually 1-2 lb (0.5-0.9 kg). This usually means that your daily calorie intake should be reduced by 500-750 calories.  Eating 1,200 - 1,500 calories per day can help most women lose weight.  Eating 1,500 - 1,800 calories per day can help most men lose weight.  What is my plan? My goal is to  have __________ calories per day. If I have this many calories per day, I should lose around __________ pounds per week. What do I need to know about calorie counting? In order to meet your daily calorie goal, you will need to:  Find out how many calories are in each food you would like to eat. Try to do this before you eat.  Decide how much of the food you plan to eat.  Write down what you ate and how many calories it had. Doing this is called keeping a food log.  To successfully lose weight, it is important to balance calorie counting with a healthy lifestyle that includes regular activity. Aim for 150 minutes of moderate exercise (such as walking) or 75 minutes of vigorous exercise (such as running) each week. Where do I find calorie information?  The number of calories in a food can be found on a Nutrition Facts label. If a food does not have a Nutrition Facts label, try  to look up the calories online or ask your dietitian for help. Remember that calories are listed per serving. If you choose to have more than one serving of a food, you will have to multiply the calories per serving by the amount of servings you plan to eat. For example, the label on a package of bread might say that a serving size is 1 slice and that there are 90 calories in a serving. If you eat 1 slice, you will have eaten 90 calories. If you eat 2 slices, you will have eaten 180 calories. How do I keep a food log? Immediately after each meal, record the following information in your food log:  What you ate. Don't forget to include toppings, sauces, and other extras on the food.  How much you ate. This can be measured in cups, ounces, or number of items.  How many calories each food and drink had.  The total number of calories in the meal.  Keep your food log near you, such as in a small notebook in your pocket, or use a mobile app or website. Some programs will calculate calories for you and show you how many calories you have left for the day to meet your goal. What are some calorie counting tips?  Use your calories on foods and drinks that will fill you up and not leave you hungry: ? Some examples of foods that fill you up are nuts and nut butters, vegetables, lean proteins, and high-fiber foods like whole grains. High-fiber foods are foods with more than 5 g fiber per serving. ? Drinks such as sodas, specialty coffee drinks, alcohol, and juices have a lot of calories, yet do not fill you up.  Eat nutritious foods and avoid empty calories. Empty calories are calories you get from foods or beverages that do not have many vitamins or protein, such as candy, sweets, and soda. It is better to have a nutritious high-calorie food (such as an avocado) than a food with few nutrients (such as a bag of chips).  Know how many calories are in the foods you eat most often. This will help you calculate  calorie counts faster.  Pay attention to calories in drinks. Low-calorie drinks include water and unsweetened drinks.  Pay attention to nutrition labels for "low fat" or "fat free" foods. These foods sometimes have the same amount of calories or more calories than the full fat versions. They also often have added sugar, starch, or salt, to make up for  flavor that was removed with the fat.  Find a way of tracking calories that works for you. Get creative. Try different apps or programs if writing down calories does not work for you. What are some portion control tips?  Know how many calories are in a serving. This will help you know how many servings of a certain food you can have.  Use a measuring cup to measure serving sizes. You could also try weighing out portions on a kitchen scale. With time, you will be able to estimate serving sizes for some foods.  Take some time to put servings of different foods on your favorite plates, bowls, and cups so you know what a serving looks like.  Try not to eat straight from a bag or box. Doing this can lead to overeating. Put the amount you would like to eat in a cup or on a plate to make sure you are eating the right portion.  Use smaller plates, glasses, and bowls to prevent overeating.  Try not to multitask (for example, watch TV or use your computer) while eating. If it is time to eat, sit down at a table and enjoy your food. This will help you to know when you are full. It will also help you to be aware of what you are eating and how much you are eating. What are tips for following this plan? Reading food labels  Check the calorie count compared to the serving size. The serving size may be smaller than what you are used to eating.  Check the source of the calories. Make sure the food you are eating is high in vitamins and protein and low in saturated and trans fats. Shopping  Read nutrition labels while you shop. This will help you make healthy  decisions before you decide to purchase your food.  Make a grocery list and stick to it. Cooking  Try to cook your favorite foods in a healthier way. For example, try baking instead of frying.  Use low-fat dairy products. Meal planning  Use more fruits and vegetables. Half of your plate should be fruits and vegetables.  Include lean proteins like poultry and fish. How do I count calories when eating out?  Ask for smaller portion sizes.  Consider sharing an entree and sides instead of getting your own entree.  If you get your own entree, eat only half. Ask for a box at the beginning of your meal and put the rest of your entree in it so you are not tempted to eat it.  If calories are listed on the menu, choose the lower calorie options.  Choose dishes that include vegetables, fruits, whole grains, low-fat dairy products, and lean protein.  Choose items that are boiled, broiled, grilled, or steamed. Stay away from items that are buttered, battered, fried, or served with cream sauce. Items labeled "crispy" are usually fried, unless stated otherwise.  Choose water, low-fat milk, unsweetened iced tea, or other drinks without added sugar. If you want an alcoholic beverage, choose a lower calorie option such as a glass of wine or light beer.  Ask for dressings, sauces, and syrups on the side. These are usually high in calories, so you should limit the amount you eat.  If you want a salad, choose a garden salad and ask for grilled meats. Avoid extra toppings like bacon, cheese, or fried items. Ask for the dressing on the side, or ask for olive oil and vinegar or lemon to use as dressing.  Estimate how many servings of a food you are given. For example, a serving of cooked rice is  cup or about the size of half a baseball. Knowing serving sizes will help you be aware of how much food you are eating at restaurants. The list below tells you how big or small some common portion sizes are based  on everyday objects: ? 1 oz-4 stacked dice. ? 3 oz-1 deck of cards. ? 1 tsp-1 die. ? 1 Tbsp- a ping-pong ball. ? 2 Tbsp-1 ping-pong ball. ?  cup- baseball. ? 1 cup-1 baseball. Summary  Calorie counting means keeping track of how many calories you eat and drink each day. If you eat fewer calories than your body needs, you should lose weight.  A healthy amount of weight to lose per week is usually 1-2 lb (0.5-0.9 kg). This usually means reducing your daily calorie intake by 500-750 calories.  The number of calories in a food can be found on a Nutrition Facts label. If a food does not have a Nutrition Facts label, try to look up the calories online or ask your dietitian for help.  Use your calories on foods and drinks that will fill you up, and not on foods and drinks that will leave you hungry.  Use smaller plates, glasses, and bowls to prevent overeating. This information is not intended to replace advice given to you by your health care provider. Make sure you discuss any questions you have with your health care provider. Document Released: 02/04/2005 Document Revised: 01/05/2016 Document Reviewed: 01/05/2016 Elsevier Interactive Patient Education  2018 [place]. High-Protein and High-Calorie Diet Eating high-protein and high-calorie foods can help you to gain weight, heal after an injury, and recover after an illness or surgery. What is my plan? The specific amount of daily protein and calories you need depends on:  Your body weight.  The reason this diet is recommended for you.  Generally, a high-protein, high-calorie diet involves:  Eating 250-500 extra calories each day.  Making sure that 10-35% of your daily calories come from protein.  Talk to your health care provider about how much protein and how many calories you need each day. Follow the diet as directed by your health care provider. What do I need to know about this diet?  Ask your health care provider if  you should take a nutritional supplement.  Try to eat six small meals each day instead of three large meals.  Eat a balanced diet, including one food that is high in protein at each meal.  Keep nutritious snacks handy, such as nuts, trail mixes, dried fruit, and yogurt.  If you have kidney disease or diabetes, eating too much protein may put extra stress on your kidneys. Talk to your health care provider if you have either of those conditions. What are some high-protein foods? Grains Quinoa. Bulgur wheat. Vegetables Soybeans. Peas. Meats and Other Protein Sources Beef, pork, and poultry. Fish and seafood. Eggs. Tofu. Textured vegetable protein (TVP). Peanut butter. Nuts and seeds. Dried beans. Protein powders. Dairy Whole milk. Whole-milk yogurt. Powdered milk. Cheese. [employer]. Eggnog. Beverages High-protein supplement drinks. Soy milk. Other Protein bars. The items listed above may not be a complete list of recommended foods or beverages. Contact your dietitian for more options. What are some high-calorie foods? Grains Pasta. Quick breads. Muffins. Pancakes. Ready-to-eat cereal. Vegetables Vegetables cooked in oil or butter. Fried potatoes. Fruits Dried fruit. Fruit leather. Canned fruit in syrup. Fruit juice. Avocados. Meats and Other  Protein Sources Peanut butter. Nuts and seeds. Dairy Heavy cream. Whipped cream. Cream cheese. Sour cream. Ice cream. Custard. Pudding. Beverages Meal-replacement beverages. Nutrition shakes. Fruit juice. Sugar-sweetened soft drinks. Condiments Salad dressing. Mayonnaise. Alfredo sauce. Fruit preserves or jelly. Honey. Syrup. Sweets/Desserts Cake. Cookies. Pie. Pastries. Candy bars. Chocolate. Fats and Oils Butter or margarine. Oil. Gravy. Other Meal-replacement bars. The items listed above may not be a complete list of recommended foods or beverages. Contact your dietitian for more options. What are some tips for including  high-protein and high-calorie foods in my diet?  Add whole milk, half-and-half, or heavy cream to cereal, pudding, soup, or hot cocoa.  Add whole milk to instant breakfast drinks.  Add peanut butter to oatmeal or smoothies.  Add powdered milk to baked goods, smoothies, or milkshakes.  Add powdered milk, cream, or butter to mashed potatoes.  Add cheese to cooked vegetables.  Make whole-milk yogurt parfaits. Top them with granola, fruit, or nuts.  Add cottage cheese to your fruit.  Add avocados, cheese, or both to sandwiches or salads.  Add meat, poultry, or seafood to rice, pasta, casseroles, salads, and soups.  Use mayonnaise when making egg salad, chicken salad, or tuna salad.  Use peanut butter as a topping for pretzels, celery, or crackers.  Add beans to casseroles, dips, and spreads.  Add pureed beans to sauces and soups.  Replace calorie-free drinks with calorie-containing drinks, such as milk and fruit juice. This information is not intended to replace advice given to you by your health care provider. Make sure you discuss any questions you have with your health care provider. Document Released: 02/04/2005 Document Revised: 07/13/2015 Document Reviewed: 07/20/2013 Elsevier Interactive Patient Education  2018 [place] [employer] A fad diet is a diet intended for fast weight loss. Popular fad diets include:  Low- and no-carbohydrate diets.  Liquid formula diets.  Very low-calorie diets.  Special food combination diets, such as the raw food diet.  Fad diets usually do not lead to permanent weight loss. How do I recognize a fad diet? If the information about a way of eating promises results that sound too good to be true, it is probably a fad diet. Fad diets often:  Promote "magic" or "miracle" foods.  Guarantee a quick fix to your weight problems.  List "good foods" and "bad foods."  Have rigid menus and a strict calorie restriction.  Require taking  pills, herbs, or powders.  Require you to buy a particular product.  Require you to skip meals or to replace meals with a special drink or food bar.  Require specific food combinations.  Require you to cut out an entire food group, such as carbohydrates or fat.  Require you to eat large quantities of one or more foods.  Do not include a health warning.  Do not require you to increase your physical activity.  Make dramatic claims that are refuted by reputable scientific organizations.  What are the dangers of fad diets? There is no scientific evidence that fad diets work, and some may do more harm than good. Dangers of fad diets include:  Weight gain after stopping the diet. Most fad diets are too impractical to follow for the long term. People eventually stop dieting and go back to their usual eating patterns. This creates a yo-yo effect of weight loss and weight gain that is hard on the body and mind.  Nutrient deficiency. If the diet restricts certain types of food, there is a risk of  becoming deficient in certain vitamins and minerals.  Problems associated with inactivity. Many fad diets do not encourage physical activity. Physical activity is key to maintaining long-term weight loss. Being inactive is also a major risk factor for heart disease, stroke, and diabetes.  How can I lose weight without a fad diet?  A healthy way to lose weight and maintain a healthy weight is to:  Eat fewer calories.  Choose healthy foods, such as vegetables, whole grains, fruits, lean proteins, and healthy fats.  Balance your overall food intake with physical activity.  All foods, in moderation, can be a part of your eating plan while you work to achieve a healthy weight. Making lifestyle changes to your eating and physical activity habits will allow you to work toward lasting change. This information is not intended to replace advice given to you by your health care provider. Make sure you  discuss any questions you have with your health care provider. Document Released: 11/19/2013 Document Revised: 08/25/2015 Document Reviewed: 07/20/2013 Elsevier Interactive Patient Education  2018 [employer].
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
## 2017-03-11 ENCOUNTER — Other Ambulatory Visit: Payer: Self-pay | Admitting: Nurse Practitioner

## 2017-03-11 DIAGNOSIS — J4520 Mild intermittent asthma, uncomplicated: Secondary | ICD-10-CM

## 2017-03-11 MED ORDER — FLUTICASONE PROPIONATE 50 MCG/ACT NA SUSP: 2.0000 | Freq: Every day | NASAL | 2 refills | Status: DC

## 2017-03-13 ENCOUNTER — Encounter (HOSPITAL_COMMUNITY): Payer: Self-pay

## 2017-03-13 NOTE — Pre-Procedure Instructions (Signed)
The last office visit note with Raul Del, NP 03/07/17 is in epic.

## 2017-03-13 NOTE — Patient Instructions (Addendum)
Your procedure is scheduled on: Tuesday, Jan. 29, 2019   Surgery Time:  7:30AM-9:30AM   Report to [facility]  Entrance   Follow map to Short Stay on first floor at 5:30 AM   Call this number if you have problems the morning of surgery [phone]   Do not eat food or drink liquids :After Midnight.   Do NOT smoke after Midnight   Complete one Ensure drink the morning of surgery 3 hours prior to scheduled surgery (by 4:30AM)   Take these medicines the morning of surgery with A SIP OF WATER: None                               You may not have any metal on your body including hair pins, jewelry, and body piercings             Do not wear make-up, lotions, powders, perfumes/cologne, or deodorant             Do not wear nail polish.  Do not shave  48 hours prior to surgery.               Do not bring valuables to the hospital. [place].   Contacts, dentures or bridgework may not be worn into surgery.   Patients discharged the day of surgery will not be allowed to drive home.   Name and phone number of your driver:   Special Instructions: Bring a copy of your healthcare power of attorney and living will documents         the day of surgery if you haven't scanned them in before.              Please read over the following fact sheets you were given:   [facility] - Preparing for Surgery Before surgery, you can play an important role.  Because skin is not sterile, your skin needs to be as free of germs as possible.  You can reduce the number of germs on your skin by washing with CHG (chlorahexidine gluconate) soap before surgery.  CHG is an antiseptic cleaner which kills germs and bonds with the skin to continue killing germs even after washing. Please DO NOT use if you have an allergy to CHG or antibacterial soaps.  If your skin becomes reddened/irritated stop using the CHG and inform your nurse when you arrive at  Short Stay. Do not shave (including legs and underarms) for at least 48 hours prior to the first CHG shower.  You may shave your face/neck.  Please follow these instructions carefully:  1.  Shower with CHG Soap the night before surgery and the  morning of surgery.  2.  If you choose to wash your hair, wash your hair first as usual with your normal  shampoo.  3.  After you shampoo, rinse your hair and body thoroughly to remove the shampoo.                             4.  Use CHG as you would any other liquid soap.  You can apply chg directly to the skin and wash.  Gently with a scrungie or clean washcloth.  5.  Apply the CHG Soap to your body ONLY FROM THE  NECK DOWN.   Do   not use on face/ open                           Wound or open sores. Avoid contact with eyes, ears mouth and   genitals (private parts).                       Wash face,  Genitals (private parts) with your normal soap.             6.  Wash thoroughly, paying special attention to the area where your    surgery  will be performed.  7.  Thoroughly rinse your body with warm water from the neck down.  8.  DO NOT shower/wash with your normal soap after using and rinsing off the CHG Soap.                9.  Pat yourself dry with a clean towel.            10.  Wear clean pajamas.            11.  Place clean sheets on your bed the night of your first shower and do not  sleep with pets. Day of Surgery : Do not apply any lotions/deodorants the morning of surgery.  Please wear clean clothes to the hospital/surgery center.  FAILURE TO FOLLOW THESE INSTRUCTIONS MAY RESULT IN THE CANCELLATION OF YOUR SURGERY  PATIENT SIGNATURE_________________________________  NURSE SIGNATURE__________________________________  ________________________________________________________________________

## 2017-03-14 ENCOUNTER — Encounter (HOSPITAL_COMMUNITY)
Admission: RE | Admit: 2017-03-14 | Discharge: 2017-03-14 | Disposition: A | Discharge status: Home / Self Care | Payer: 59 | Source: Ambulatory Visit | Attending: General Surgery | Admitting: General Surgery

## 2017-03-14 ENCOUNTER — Encounter (HOSPITAL_COMMUNITY): Payer: Self-pay

## 2017-03-14 ENCOUNTER — Other Ambulatory Visit: Payer: Self-pay

## 2017-03-14 DIAGNOSIS — Z01812 Encounter for preprocedural laboratory examination: Secondary | ICD-10-CM | POA: Insufficient documentation

## 2017-03-14 DIAGNOSIS — K8020 Calculus of gallbladder without cholecystitis without obstruction: Secondary | ICD-10-CM | POA: Diagnosis not present

## 2017-03-14 DIAGNOSIS — K439 Ventral hernia without obstruction or gangrene: Secondary | ICD-10-CM | POA: Insufficient documentation

## 2017-03-14 HISTORY — DX: Personal history of urinary (tract) infections: Z87.440

## 2017-03-14 HISTORY — DX: Obesity, unspecified: E66.9

## 2017-03-14 LAB — CBC
HCT: 39.9 % (ref 36.0–46.0)
Hemoglobin: 13.9 g/dL (ref 12.0–15.0)
MCH: 31.4 pg (ref 26.0–34.0)
MCHC: 34.8 g/dL (ref 30.0–36.0)
MCV: 90.3 fL (ref 78.0–100.0)
PLATELETS: 325 10*3/uL (ref 150–400)
RBC: 4.42 MIL/uL (ref 3.87–5.11)
RDW: 12 % (ref 11.5–15.5)
WBC: 13 10*3/uL — ABNORMAL HIGH (ref 4.0–10.5)

## 2017-03-14 NOTE — Pre-Procedure Instructions (Signed)
CBC results 03/14/17 sent to Dr. Lear Ng  in basket in epic.
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
## 2017-03-18 ENCOUNTER — Encounter (HOSPITAL_COMMUNITY): Payer: Self-pay | Admitting: *Deleted

## 2017-03-18 ENCOUNTER — Ambulatory Visit (HOSPITAL_COMMUNITY): Payer: 59 | Admitting: Anesthesiology

## 2017-03-18 ENCOUNTER — Ambulatory Visit (HOSPITAL_COMMUNITY)
Admission: RE | Admit: 2017-03-18 | Discharge: 2017-03-18 | Disposition: A | Discharge status: Home / Self Care | Payer: 59 | Source: Ambulatory Visit | Attending: General Surgery | Admitting: General Surgery

## 2017-03-18 ENCOUNTER — Encounter (HOSPITAL_COMMUNITY)
Admission: RE | Disposition: A | Discharge status: Home / Self Care | Payer: Self-pay | Source: Ambulatory Visit | Attending: General Surgery

## 2017-03-18 ENCOUNTER — Other Ambulatory Visit: Payer: Self-pay

## 2017-03-18 ENCOUNTER — Ambulatory Visit (HOSPITAL_COMMUNITY): Payer: 59

## 2017-03-18 DIAGNOSIS — Z419 Encounter for procedure for purposes other than remedying health state, unspecified: Secondary | ICD-10-CM

## 2017-03-18 DIAGNOSIS — Z975 Presence of (intrauterine) contraceptive device: Secondary | ICD-10-CM | POA: Diagnosis not present

## 2017-03-18 DIAGNOSIS — K8010 Calculus of gallbladder with chronic cholecystitis without obstruction: Secondary | ICD-10-CM | POA: Insufficient documentation

## 2017-03-18 DIAGNOSIS — Z833 Family history of diabetes mellitus: Secondary | ICD-10-CM | POA: Diagnosis not present

## 2017-03-18 DIAGNOSIS — K660 Peritoneal adhesions (postprocedural) (postinfection): Secondary | ICD-10-CM | POA: Insufficient documentation

## 2017-03-18 DIAGNOSIS — Z8249 Family history of ischemic heart disease and other diseases of the circulatory system: Secondary | ICD-10-CM | POA: Insufficient documentation

## 2017-03-18 DIAGNOSIS — K432 Incisional hernia without obstruction or gangrene: Secondary | ICD-10-CM | POA: Diagnosis not present

## 2017-03-18 DIAGNOSIS — Z880 Allergy status to penicillin: Secondary | ICD-10-CM | POA: Insufficient documentation

## 2017-03-18 DIAGNOSIS — K8050 Calculus of bile duct without cholangitis or cholecystitis without obstruction: Secondary | ICD-10-CM | POA: Diagnosis not present

## 2017-03-18 DIAGNOSIS — D649 Anemia, unspecified: Secondary | ICD-10-CM | POA: Diagnosis not present

## 2017-03-18 DIAGNOSIS — K8020 Calculus of gallbladder without cholecystitis without obstruction: Secondary | ICD-10-CM | POA: Diagnosis not present

## 2017-03-18 DIAGNOSIS — J45909 Unspecified asthma, uncomplicated: Secondary | ICD-10-CM | POA: Diagnosis not present

## 2017-03-18 HISTORY — PX: CHOLECYSTECTOMY: SHX55

## 2017-03-18 HISTORY — PX: INCISIONAL HERNIA REPAIR: SHX193

## 2017-03-18 LAB — PREGNANCY, URINE: Preg Test, Ur: NEGATIVE

## 2017-03-18 SURGERY — LAPAROSCOPIC CHOLECYSTECTOMY WITH INTRAOPERATIVE CHOLANGIOGRAM
Anesthesia: General

## 2017-03-18 MED ORDER — ROCURONIUM BROMIDE 50 MG/5ML IV SOSY
PREFILLED_SYRINGE | INTRAVENOUS | Status: DC | PRN
  Administered 2017-03-18: 50 mg via INTRAVENOUS
  Administered 2017-03-18: 10 mg via INTRAVENOUS
  Administered 2017-03-18: 20 mg via INTRAVENOUS
  Administered 2017-03-18 (×2): 10 mg via INTRAVENOUS

## 2017-03-18 MED ORDER — SUGAMMADEX SODIUM 500 MG/5ML IV SOLN
INTRAVENOUS | Status: AC
  Filled 2017-03-18: qty 5

## 2017-03-18 MED ORDER — ONDANSETRON HCL 4 MG/2ML IJ SOLN
INTRAMUSCULAR | Status: DC | PRN
  Administered 2017-03-18: 4 mg via INTRAVENOUS

## 2017-03-18 MED ORDER — SCOPOLAMINE 1 MG/3DAYS TD PT72
MEDICATED_PATCH | TRANSDERMAL | Status: AC
  Administered 2017-03-18: 1.5 mg via TRANSDERMAL
  Filled 2017-03-18: qty 1

## 2017-03-18 MED ORDER — GABAPENTIN 300 MG PO CAPS
300.0000 mg | ORAL_CAPSULE | ORAL | Status: AC
  Administered 2017-03-18: 300 mg via ORAL
  Filled 2017-03-18: qty 1

## 2017-03-18 MED ORDER — PROPOFOL 10 MG/ML IV BOLUS
INTRAVENOUS | Status: DC | PRN
  Administered 2017-03-18: 150 mg via INTRAVENOUS

## 2017-03-18 MED ORDER — ONDANSETRON HCL 4 MG/2ML IJ SOLN
INTRAMUSCULAR | Status: AC
  Filled 2017-03-18: qty 2

## 2017-03-18 MED ORDER — KETOROLAC TROMETHAMINE 30 MG/ML IJ SOLN
INTRAMUSCULAR | Status: AC
  Administered 2017-03-18: 30 mg via INTRAVENOUS
  Filled 2017-03-18: qty 1

## 2017-03-18 MED ORDER — MEPERIDINE HCL 50 MG/ML IJ SOLN: 6.2500 mg | INTRAMUSCULAR | Status: DC | PRN

## 2017-03-18 MED ORDER — CHLORHEXIDINE GLUCONATE CLOTH 2 % EX PADS: 6.0000 | MEDICATED_PAD | Freq: Once | CUTANEOUS | Status: DC

## 2017-03-18 MED ORDER — SCOPOLAMINE 1 MG/3DAYS TD PT72
1.0000 | MEDICATED_PATCH | TRANSDERMAL | Status: DC
  Administered 2017-03-18: 1.5 mg via TRANSDERMAL

## 2017-03-18 MED ORDER — HYDROCODONE-ACETAMINOPHEN 5-325 MG PO TABS: 1.0000 | ORAL_TABLET | Freq: Four times a day (QID) | ORAL | 0 refills | Status: DC | PRN

## 2017-03-18 MED ORDER — PHENYLEPHRINE 40 MCG/ML (10ML) SYRINGE FOR IV PUSH (FOR BLOOD PRESSURE SUPPORT)
PREFILLED_SYRINGE | INTRAVENOUS | Status: DC | PRN
  Administered 2017-03-18 (×2): 80 ug via INTRAVENOUS
  Administered 2017-03-18: 120 ug via INTRAVENOUS
  Administered 2017-03-18: 80 ug via INTRAVENOUS

## 2017-03-18 MED ORDER — LIDOCAINE 2% (20 MG/ML) 5 ML SYRINGE
INTRAMUSCULAR | Status: DC | PRN
  Administered 2017-03-18: 5 mg via INTRAVENOUS
  Administered 2017-03-18: 50 mg via INTRAVENOUS

## 2017-03-18 MED ORDER — LACTATED RINGERS IV SOLN
INTRAVENOUS | Status: DC | PRN
  Administered 2017-03-18 (×2): via INTRAVENOUS

## 2017-03-18 MED ORDER — HYDROMORPHONE HCL 1 MG/ML IJ SOLN
0.2500 mg | INTRAMUSCULAR | Status: DC | PRN
  Administered 2017-03-18: 0.5 mg via INTRAVENOUS
  Administered 2017-03-18: 0.25 mg via INTRAVENOUS

## 2017-03-18 MED ORDER — HYDROMORPHONE HCL 1 MG/ML IJ SOLN
INTRAMUSCULAR | Status: AC
  Administered 2017-03-18: 0.5 mg via INTRAVENOUS
  Filled 2017-03-18: qty 1

## 2017-03-18 MED ORDER — LIP MEDEX EX OINT
TOPICAL_OINTMENT | CUTANEOUS | Status: AC
  Filled 2017-03-18: qty 7

## 2017-03-18 MED ORDER — MIDAZOLAM HCL 2 MG/2ML IJ SOLN
INTRAMUSCULAR | Status: AC
  Filled 2017-03-18: qty 2

## 2017-03-18 MED ORDER — HYDROCODONE-ACETAMINOPHEN 7.5-325 MG PO TABS
1.0000 | ORAL_TABLET | Freq: Once | ORAL | Status: AC | PRN
  Administered 2017-03-18: 1 via ORAL

## 2017-03-18 MED ORDER — DEXAMETHASONE SODIUM PHOSPHATE 4 MG/ML IJ SOLN
INTRAMUSCULAR | Status: DC | PRN
  Administered 2017-03-18: 10 mg via INTRAVENOUS

## 2017-03-18 MED ORDER — CIPROFLOXACIN IN D5W 400 MG/200ML IV SOLN
400.0000 mg | INTRAVENOUS | Status: AC
  Administered 2017-03-18: 400 mg via INTRAVENOUS
  Filled 2017-03-18: qty 200

## 2017-03-18 MED ORDER — ROCURONIUM BROMIDE 50 MG/5ML IV SOSY
PREFILLED_SYRINGE | INTRAVENOUS | Status: AC
  Filled 2017-03-18: qty 10

## 2017-03-18 MED ORDER — HYDROCODONE-ACETAMINOPHEN 7.5-325 MG PO TABS
ORAL_TABLET | ORAL | Status: AC
  Filled 2017-03-18: qty 1

## 2017-03-18 MED ORDER — BUPIVACAINE-EPINEPHRINE (PF) 0.5% -1:200000 IJ SOLN
INTRAMUSCULAR | Status: DC | PRN
  Administered 2017-03-18: 30 mL

## 2017-03-18 MED ORDER — FENTANYL CITRATE (PF) 250 MCG/5ML IJ SOLN
INTRAMUSCULAR | Status: AC
  Filled 2017-03-18: qty 5

## 2017-03-18 MED ORDER — PROMETHAZINE HCL 25 MG/ML IJ SOLN: 6.2500 mg | INTRAMUSCULAR | Status: DC | PRN

## 2017-03-18 MED ORDER — CLONIDINE HCL 0.2 MG PO TABS
0.2000 mg | ORAL_TABLET | Freq: Once | ORAL | Status: AC
  Administered 2017-03-18: 0.2 mg via ORAL
  Filled 2017-03-18: qty 1

## 2017-03-18 MED ORDER — PHENYLEPHRINE 40 MCG/ML (10ML) SYRINGE FOR IV PUSH (FOR BLOOD PRESSURE SUPPORT)
PREFILLED_SYRINGE | INTRAVENOUS | Status: AC
  Filled 2017-03-18: qty 10

## 2017-03-18 MED ORDER — IOPAMIDOL (ISOVUE-300) INJECTION 61%
INTRAVENOUS | Status: AC
  Filled 2017-03-18: qty 50

## 2017-03-18 MED ORDER — LACTATED RINGERS IR SOLN
Status: DC | PRN
  Administered 2017-03-18: 1000 mL

## 2017-03-18 MED ORDER — FENTANYL CITRATE (PF) 100 MCG/2ML IJ SOLN
INTRAMUSCULAR | Status: AC
  Filled 2017-03-18: qty 2

## 2017-03-18 MED ORDER — FENTANYL CITRATE (PF) 100 MCG/2ML IJ SOLN
INTRAMUSCULAR | Status: DC | PRN
  Administered 2017-03-18: 100 ug via INTRAVENOUS
  Administered 2017-03-18 (×2): 50 ug via INTRAVENOUS
  Administered 2017-03-18: 100 ug via INTRAVENOUS
  Administered 2017-03-18: 50 ug via INTRAVENOUS

## 2017-03-18 MED ORDER — ACETAMINOPHEN 10 MG/ML IV SOLN
1000.0000 mg | Freq: Once | INTRAVENOUS | Status: DC | PRN
Start: 2017-03-18 — End: 2017-03-18

## 2017-03-18 MED ORDER — ACETAMINOPHEN 500 MG PO TABS
1000.0000 mg | ORAL_TABLET | ORAL | Status: AC
  Administered 2017-03-18: 1000 mg via ORAL
  Filled 2017-03-18: qty 2

## 2017-03-18 MED ORDER — DEXAMETHASONE SODIUM PHOSPHATE 10 MG/ML IJ SOLN
INTRAMUSCULAR | Status: AC
  Filled 2017-03-18: qty 1

## 2017-03-18 MED ORDER — SUGAMMADEX SODIUM 200 MG/2ML IV SOLN
INTRAVENOUS | Status: DC | PRN
  Administered 2017-03-18: 424.4 mg via INTRAVENOUS

## 2017-03-18 MED ORDER — IOPAMIDOL (ISOVUE-300) INJECTION 61%
INTRAVENOUS | Status: DC | PRN
  Administered 2017-03-18: 7 mL

## 2017-03-18 MED ORDER — MIDAZOLAM HCL 5 MG/5ML IJ SOLN
INTRAMUSCULAR | Status: DC | PRN
  Administered 2017-03-18: 1 mg via INTRAVENOUS

## 2017-03-18 MED ORDER — PROPOFOL 10 MG/ML IV BOLUS
INTRAVENOUS | Status: AC
  Filled 2017-03-18: qty 20

## 2017-03-18 MED ORDER — KETOROLAC TROMETHAMINE 30 MG/ML IJ SOLN
30.0000 mg | Freq: Once | INTRAMUSCULAR | Status: AC
  Administered 2017-03-18: 30 mg via INTRAVENOUS

## 2017-03-18 MED ORDER — 0.9 % SODIUM CHLORIDE (POUR BTL) OPTIME
TOPICAL | Status: DC | PRN
  Administered 2017-03-18: 1000 mL

## 2017-03-18 MED ORDER — ROCURONIUM BROMIDE 50 MG/5ML IV SOSY
PREFILLED_SYRINGE | INTRAVENOUS | Status: AC
  Filled 2017-03-18: qty 5

## 2017-03-18 MED ORDER — LIDOCAINE 2% (20 MG/ML) 5 ML SYRINGE
INTRAMUSCULAR | Status: AC
  Filled 2017-03-18: qty 5

## 2017-03-18 MED ORDER — EPHEDRINE 5 MG/ML INJ
INTRAVENOUS | Status: AC
  Filled 2017-03-18: qty 10

## 2017-03-18 MED ORDER — EPHEDRINE SULFATE-NACL 50-0.9 MG/10ML-% IV SOSY
PREFILLED_SYRINGE | INTRAVENOUS | Status: DC | PRN
  Administered 2017-03-18: 10 mg via INTRAVENOUS

## 2017-03-18 MED ORDER — CELECOXIB 200 MG PO CAPS
200.0000 mg | ORAL_CAPSULE | ORAL | Status: AC
  Administered 2017-03-18: 200 mg via ORAL
  Filled 2017-03-18: qty 1

## 2017-03-18 MED ORDER — BUPIVACAINE-EPINEPHRINE (PF) 0.5% -1:200000 IJ SOLN
INTRAMUSCULAR | Status: AC
  Filled 2017-03-18: qty 30

## 2017-03-18 SURGICAL SUPPLY — 61 items
ADH SKN CLS APL DERMABOND .7 (GAUZE/BANDAGES/DRESSINGS) ×1
APL SKNCLS STERI-STRIP NONHPOA (GAUZE/BANDAGES/DRESSINGS)
APPLIER CLIP ROT 10 11.4 M/L (STAPLE) ×2
APR CLP MED LRG 11.4X10 (STAPLE) ×1
BAG SPEC RTRVL 10 TROC 200 (ENDOMECHANICALS) ×1
BAG SPEC RTRVL LRG 6X4 10 (ENDOMECHANICALS)
BENZOIN TINCTURE PRP APPL 2/3 (GAUZE/BANDAGES/DRESSINGS) IMPLANT
BINDER ABDOMINAL 12 ML 46-62 (SOFTGOODS) ×2 IMPLANT
CABLE HIGH FREQUENCY MONO STRZ (ELECTRODE) ×2 IMPLANT
CATH REDDICK CHOLANGI 4FR 50CM (CATHETERS) IMPLANT
CHLORAPREP W/TINT 26ML (MISCELLANEOUS) ×2 IMPLANT
CLIP APPLIE ROT 10 11.4 M/L (STAPLE) ×1 IMPLANT
COVER MAYO STAND STRL (DRAPES) ×2 IMPLANT
COVER SURGICAL LIGHT HANDLE (MISCELLANEOUS) ×2 IMPLANT
DECANTER SPIKE VIAL GLASS SM (MISCELLANEOUS) ×2 IMPLANT
DERMABOND ADVANCED (GAUZE/BANDAGES/DRESSINGS) ×1
DERMABOND ADVANCED .7 DNX12 (GAUZE/BANDAGES/DRESSINGS) ×1 IMPLANT
DEVICE SECURE STRAP 25 ABSORB (INSTRUMENTS) IMPLANT
DEVICE TROCAR PUNCTURE CLOSURE (ENDOMECHANICALS) ×2 IMPLANT
DISSECTOR BLUNT TIP ENDO 5MM (MISCELLANEOUS) IMPLANT
DRAPE C-ARM 42X120 X-RAY (DRAPES) ×2 IMPLANT
DRAPE INCISE IOBAN 66X45 STRL (DRAPES) ×2 IMPLANT
ELECT PENCIL ROCKER SW 15FT (MISCELLANEOUS) ×1 IMPLANT
ELECT REM PT RETURN 15FT ADLT (MISCELLANEOUS) ×2 IMPLANT
GLOVE BIOGEL PI IND STRL 7.5 (GLOVE) ×1 IMPLANT
GLOVE BIOGEL PI INDICATOR 7.5 (GLOVE) ×1
GLOVE ECLIPSE 7.5 STRL STRAW (GLOVE) ×2 IMPLANT
GOWN STRL REUS W/TWL XL LVL3 (GOWN DISPOSABLE) ×6 IMPLANT
HEMOSTAT SNOW SURGICEL 2X4 (HEMOSTASIS) ×1 IMPLANT
HEMOSTAT SURGICEL 4X8 (HEMOSTASIS) IMPLANT
KIT BASIN OR (CUSTOM PROCEDURE TRAY) ×2 IMPLANT
MARKER SKIN DUAL TIP RULER LAB (MISCELLANEOUS) ×2 IMPLANT
MESH VENTRALEX ST 8CM LRG (Mesh General) ×1 IMPLANT
NDL SPNL 22GX3.5 QUINCKE BK (NEEDLE) ×1 IMPLANT
NEEDLE SPNL 22GX3.5 QUINCKE BK (NEEDLE) ×2 IMPLANT
PAD POSITIONING PINK XL (MISCELLANEOUS) IMPLANT
POSITIONER SURGICAL ARM (MISCELLANEOUS) IMPLANT
POUCH RETRIEVAL ECOSAC 10 (ENDOMECHANICALS) IMPLANT
POUCH RETRIEVAL ECOSAC 10MM (ENDOMECHANICALS) ×1
POUCH SPECIMEN RETRIEVAL 10MM (ENDOMECHANICALS) IMPLANT
SCISSORS METZENBAUM CVD 33 (INSTRUMENTS) ×2 IMPLANT
SET CHOLANGIOGRAPH MIX (MISCELLANEOUS) ×2 IMPLANT
SET IRRIG TUBING LAPAROSCOPIC (IRRIGATION / IRRIGATOR) ×2 IMPLANT
SHEARS HARMONIC ACE PLUS 36CM (ENDOMECHANICALS) ×2 IMPLANT
SLEEVE XCEL OPT CAN 5 100 (ENDOMECHANICALS) ×2 IMPLANT
SOLUTION ANTI FOG 6CC (MISCELLANEOUS) ×2 IMPLANT
STRIP CLOSURE SKIN 1/2X4 (GAUZE/BANDAGES/DRESSINGS) IMPLANT
SUT MNCRL AB 4-0 PS2 18 (SUTURE) ×2 IMPLANT
SUT NOVA NAB GS-21 0 18 T12 DT (SUTURE) ×3 IMPLANT
SUT PROLENE 0 CT 1 CR/8 (SUTURE) IMPLANT
SUT VIC AB 3-0 SH 27 (SUTURE) ×2
SUT VIC AB 3-0 SH 27X BRD (SUTURE) IMPLANT
TAPE CLOTH 4X10 WHT NS (GAUZE/BANDAGES/DRESSINGS) IMPLANT
TOWEL OR 17X26 10 PK STRL BLUE (TOWEL DISPOSABLE) ×2 IMPLANT
TOWEL OR NON WOVEN STRL DISP B (DISPOSABLE) ×2 IMPLANT
TRAY FOLEY W/METER SILVER 16FR (SET/KITS/TRAYS/PACK) ×2 IMPLANT
TRAY LAPAROSCOPIC (CUSTOM PROCEDURE TRAY) ×2 IMPLANT
TROCAR BLADELESS OPT 5 100 (ENDOMECHANICALS) ×2 IMPLANT
TROCAR XCEL BLUNT TIP 100MML (ENDOMECHANICALS) ×2 IMPLANT
TROCAR XCEL NON-BLD 11X100MML (ENDOMECHANICALS) ×2 IMPLANT
TUBING INSUF HEATED (TUBING) ×2 IMPLANT
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
## 2017-03-18 NOTE — Interval H&P Note (Signed)
History and Physical Interval Note:  03/18/2017 7:24 AM  Karen Jordan  has presented today for surgery, with the diagnosis of cholelithiasis and ventral incisional hernia  The various methods of treatment have been discussed with the patient and family. After consideration of risks, benefits and other options for treatment, the patient has consented to  Procedure(s): LAPAROSCOPIC CHOLECYSTECTOMY WITH INTRAOPERATIVE CHOLANGIOGRAM (N/A) LAPAROSCOPIC INCISIONAL HERNIA (N/A) as a surgical intervention .  The patient's history has been reviewed, patient examined, no change in status, stable for surgery.  I have reviewed the patient's chart and labs.  Questions were answered to the patient's satisfaction.     Darene Lamer Rocklin Soderquist

## 2017-03-18 NOTE — Anesthesia Postprocedure Evaluation (Signed)
Anesthesia Post Note  Patient: Karen Jordan  Procedure(s) Performed: LAPAROSCOPIC CHOLECYSTECTOMY WITH INTRAOPERATIVE CHOLANGIOGRAM (N/A ) LAPAROSCOPIC INCISIONAL HERNIA (N/A )     Patient location during evaluation: PACU Anesthesia Type: General Level of consciousness: awake and alert Pain management: pain level controlled Vital Signs Assessment: post-procedure vital signs reviewed and stable Respiratory status: spontaneous breathing, nonlabored ventilation, respiratory function stable and patient connected to nasal cannula oxygen Cardiovascular status: blood pressure returned to baseline and stable Postop Assessment: no apparent nausea or vomiting Anesthetic complications: no    Last Vitals:  Vitals:   03/18/17 1115 03/18/17 1123  BP: 114/62 (!) 142/91  Pulse: (!) 101 (!) 105  Resp: 17 18  Temp: 36.8 C 36.9 C  SpO2: 94% 94%    Last Pain:  Vitals:   03/18/17 1123  TempSrc:   PainSc: 7                  Barnet Glasgow

## 2017-03-18 NOTE — Anesthesia Procedure Notes (Signed)
Procedure Name: Intubation Date/Time: 03/18/2017 7:39 AM Performed by: Deliah Boston, CRNA Pre-anesthesia Checklist: Patient identified, Emergency Drugs available, Suction available and Patient being monitored Patient Re-evaluated:Patient Re-evaluated prior to induction Oxygen Delivery Method: Circle system utilized Preoxygenation: Pre-oxygenation with 100% oxygen Induction Type: IV induction Ventilation: Mask ventilation without difficulty Tube type: Oral Tube size: 7.0 mm Number of attempts: 1 Airway Equipment and Method: Stylet and Oral airway Placement Confirmation: ETT inserted through vocal cords under direct vision,  positive ETCO2 and breath sounds checked- equal and bilateral Secured at: 23 cm Tube secured with: Tape Dental Injury: Teeth and Oropharynx as per pre-operative assessment

## 2017-03-18 NOTE — Discharge Instructions (Signed)
[facility] ______[facility] °LAPAROSCOPIC SURGERY: POST OP INSTRUCTIONS °Always review your discharge instruction sheet given to you by the facility where your surgery was performed. °IF YOU HAVE DISABILITY OR FAMILY LEAVE FORMS, YOU MUST BRING THEM TO THE OFFICE FOR PROCESSING.   °DO NOT GIVE THEM TO YOUR DOCTOR. ° °1. A prescription for pain medication may be given to you upon discharge.  Take your pain medication as prescribed, if needed.  If narcotic pain medicine is not needed, then you may take acetaminophen (Tylenol) or ibuprofen (Advil) as needed. °2. Take your usually prescribed medications unless otherwise directed. °3. If you need a refill on your pain medication, please contact your pharmacy.  They will contact our office to request authorization. Prescriptions will not be filled after 5pm or on week-ends. °4. You should follow a light diet the first few days after arrival home, such as soup and crackers, etc.  Be sure to include lots of fluids daily. °5. Most patients will experience some swelling and bruising in the area of the incisions.  Ice packs will help.  Swelling and bruising can take several days to resolve.  °6. It is common to experience some constipation if taking pain medication after surgery.  Increasing fluid intake and taking a stool softener (such as Colace) will usually help or prevent this problem from occurring.  A mild laxative (Milk of Magnesia or Miralax) should be taken according to package instructions if there are no bowel movements after 48 hours. °7. Unless discharge instructions indicate otherwise, you may remove your bandages 24-48 hours after surgery, and you may shower at that time.  You may have steri-strips (small skin tapes) in place directly over the incision.  These strips should be left on the skin for 7-10 days.  If your surgeon used skin glue on the incision, you may shower in 24 hours.  The glue will flake off over the next 2-3 weeks.  Any sutures or  staples will be removed at the office during your follow-up visit. °8. ACTIVITIES:  You may resume regular (light) daily activities beginning the next day--such as daily self-care, walking, climbing stairs--gradually increasing activities as tolerated.  You may have sexual intercourse when it is comfortable.  Refrain from any heavy lifting or straining until approved by your doctor. °a. You may drive when you are no longer taking prescription pain medication, you can comfortably wear a seatbelt, and you can safely maneuver your car and apply brakes. °b. RETURN TO WORK:  __________________________________________________________ °9. You should see your doctor in the office for a follow-up appointment approximately 2-3 weeks after your surgery.  Make sure that you call for this appointment within a day or two after you arrive home to insure a convenient appointment time. °10. OTHER INSTRUCTIONS: __________________________________________________________________________________________________________________________ __________________________________________________________________________________________________________________________ °WHEN TO CALL YOUR DOCTOR: °1. Fever over 101.0 °2. Inability to urinate °3. Continued bleeding from incision. °4. Increased pain, redness, or drainage from the incision. °5. Increasing abdominal pain ° °The clinic staff is available to answer your questions during regular business hours.  Please don’t hesitate to call and ask to speak to one of the nurses for clinical concerns.  If you have a medical emergency, go to the nearest emergency room or call 911.  A surgeon from [facility] is always on call at the hospital. °[address], [address], [place], [place]  [address] ? [address] [address], [place], [place]   [address] °[phone] ? [phone] ? FAX [phone] °Web site:   [email or web address] °

## 2017-03-18 NOTE — Anesthesia Preprocedure Evaluation (Addendum)
Anesthesia Evaluation  Patient identified by MRN, date of birth, ID band Patient awake    Reviewed: Allergy & Precautions, NPO status , Patient's Chart, lab work & pertinent test results  Airway Mallampati: II  TM Distance: >3 FB Neck ROM: Full    Dental no notable dental hx.    Pulmonary neg pulmonary ROS, Current Smoker,    Pulmonary exam normal breath sounds clear to auscultation       Cardiovascular negative cardio ROS Normal cardiovascular exam Rhythm:Regular Rate:Normal     Neuro/Psych negative neurological ROS  negative psych ROS   GI/Hepatic negative GI ROS, Neg liver ROS,   Endo/Other  negative endocrine ROS  Renal/GU negative Renal ROS  negative genitourinary   Musculoskeletal negative musculoskeletal ROS (+)   Abdominal   Peds negative pediatric ROS (+)  Hematology negative hematology ROS (+)   Anesthesia Other Findings   Reproductive/Obstetrics negative OB ROS                             Lab Results  Component Value Date   WBC 13.0 (H) 03/14/2017   HGB 13.9 03/14/2017   HCT 39.9 03/14/2017   MCV 90.3 03/14/2017   PLT 325 03/14/2017   Lab Results  Component Value Date   PREGTESTUR Negative 05/21/2016     Anesthesia Physical Anesthesia Plan  ASA: III  Anesthesia Plan: General   Post-op Pain Management:    Induction: Intravenous  PONV Risk Score and Plan: 2 and Treatment may vary due to age or medical condition, Dexamethasone and Ondansetron  Airway Management Planned: Oral ETT  Additional Equipment:   Intra-op Plan:   Post-operative Plan: Extubation in OR  Informed Consent: I have reviewed the patients History and Physical, chart, labs and discussed the procedure including the risks, benefits and alternatives for the proposed anesthesia with the patient or authorized representative who has indicated his/her understanding and acceptance.   Dental  advisory given  Plan Discussed with: CRNA  Anesthesia Plan Comments:         Anesthesia Quick Evaluation

## 2017-03-18 NOTE — Transfer of Care (Signed)
Immediate Anesthesia Transfer of Care Note  Patient: Karen Jordan  Procedure(s) Performed: Procedure(s): LAPAROSCOPIC CHOLECYSTECTOMY WITH INTRAOPERATIVE CHOLANGIOGRAM (N/A) LAPAROSCOPIC INCISIONAL HERNIA (N/A)  Patient Location: PACU  Anesthesia Type:General  Level of Consciousness: Patient easily awoken, sedated, comfortable, cooperative, following commands, responds to stimulation.   Airway & Oxygen Therapy: Patient spontaneously breathing, ventilating well, oxygen via simple oxygen mask.  Post-op Assessment: Report given to PACU RN, vital signs reviewed and stable, moving all extremities.   Post vital signs: Reviewed and stable.  Complications: No apparent anesthesia complications Last Vitals:  Vitals:   03/18/17 0536 03/18/17 0601  BP: 123/84   Pulse:    Resp:    Temp:  36.8 C  SpO2:      Last Pain:  Vitals:   03/18/17 0601  TempSrc: Oral      Patients Stated Pain Goal: 4 (49/17/91 5056)  Complications: No apparent anesthesia complications

## 2017-03-19 ENCOUNTER — Encounter (HOSPITAL_COMMUNITY): Payer: Self-pay | Admitting: General Surgery

## 2017-04-04 ENCOUNTER — Ambulatory Visit: Payer: 59 | Admitting: Nurse Practitioner

## 2017-04-04 ENCOUNTER — Other Ambulatory Visit: Payer: 59

## 2017-05-14 ENCOUNTER — Ambulatory Visit: Payer: 59 | Attending: Nurse Practitioner

## 2017-05-14 DIAGNOSIS — Z6830 Body mass index (BMI) 30.0-30.9, adult: Secondary | ICD-10-CM | POA: Diagnosis not present

## 2017-05-14 DIAGNOSIS — E669 Obesity, unspecified: Secondary | ICD-10-CM | POA: Diagnosis not present

## 2017-05-15 LAB — LIPID PANEL
CHOL/HDL RATIO: 3.1 ratio (ref 0.0–4.4)
CHOLESTEROL TOTAL: 156 mg/dL (ref 100–199)
HDL: 51 mg/dL (ref >39)
LDL CALC: 88 mg/dL (ref 0–99)
TRIGLYCERIDES: 85 mg/dL (ref 0–149)
VLDL CHOLESTEROL CAL: 17 mg/dL (ref 5–40)

## 2017-05-15 LAB — CMP14+EGFR
ALK PHOS: 59 IU/L (ref 39–117)
ALT: 14 IU/L (ref 0–32)
AST: 15 IU/L (ref 0–40)
Albumin/Globulin Ratio: 1.9 (ref 1.2–2.2)
Albumin: 4.6 g/dL (ref 3.5–5.5)
BUN/Creatinine Ratio: 11 (ref 9–23)
BUN: 10 mg/dL (ref 6–20)
Bilirubin Total: 0.5 mg/dL (ref 0.0–1.2)
CALCIUM: 9.3 mg/dL (ref 8.7–10.2)
CO2: 20 mmol/L (ref 20–29)
CREATININE: 0.88 mg/dL (ref 0.57–1.00)
Chloride: 101 mmol/L (ref 96–106)
GFR calc Af Amer: 103 mL/min/{1.73_m2} (ref >59)
GFR, EST NON AFRICAN AMERICAN: 89 mL/min/{1.73_m2} (ref >59)
GLOBULIN, TOTAL: 2.4 g/dL (ref 1.5–4.5)
GLUCOSE: 74 mg/dL (ref 65–99)
Potassium: 4.4 mmol/L (ref 3.5–5.2)
Sodium: 138 mmol/L (ref 134–144)
Total Protein: 7 g/dL (ref 6.0–8.5)

## 2017-06-26 ENCOUNTER — Other Ambulatory Visit: Payer: Self-pay | Admitting: Nurse Practitioner

## 2017-06-26 MED ORDER — ALBUTEROL SULFATE HFA 108 (90 BASE) MCG/ACT IN AERS: 2.0000 | INHALATION_SPRAY | Freq: Four times a day (QID) | RESPIRATORY_TRACT | 2 refills | Status: DC | PRN

## 2017-07-03 ENCOUNTER — Telehealth (INDEPENDENT_AMBULATORY_CARE_PROVIDER_SITE_OTHER): Payer: Self-pay | Admitting: Nurse Practitioner

## 2017-07-03 ENCOUNTER — Encounter (INDEPENDENT_AMBULATORY_CARE_PROVIDER_SITE_OTHER): Payer: Self-pay | Admitting: Nurse Practitioner

## 2017-07-03 ENCOUNTER — Ambulatory Visit (INDEPENDENT_AMBULATORY_CARE_PROVIDER_SITE_OTHER): Payer: 59 | Admitting: Nurse Practitioner

## 2017-07-03 VITALS — BP 121/88 | HR 77 | Temp 97.7°F | Resp 16 | Ht 64.0 in | Wt 226.0 lb

## 2017-07-03 DIAGNOSIS — E669 Obesity, unspecified: Secondary | ICD-10-CM

## 2017-07-03 DIAGNOSIS — J45909 Unspecified asthma, uncomplicated: Secondary | ICD-10-CM

## 2017-07-03 DIAGNOSIS — F17200 Nicotine dependence, unspecified, uncomplicated: Secondary | ICD-10-CM | POA: Diagnosis not present

## 2017-07-03 DIAGNOSIS — Z0000 Encounter for general adult medical examination without abnormal findings: Secondary | ICD-10-CM | POA: Diagnosis not present

## 2017-07-03 MED ORDER — PHENTERMINE HCL 37.5 MG PO TABS: 37.5000 mg | ORAL_TABLET | Freq: Every day | ORAL | 1 refills | Status: DC

## 2017-07-03 MED ORDER — VARENICLINE TARTRATE 0.5 MG X 11 & 1 MG X 42 PO MISC: ORAL | 0 refills | Status: DC

## 2017-07-03 MED FILL — CHANTIX STARTING MONTH BOX: 0.5 MG X 11 | 28 days supply | Qty: 53 | Fill #0

## 2017-07-03 MED FILL — PHENTERMINE 37.5 MG TABLET: 37.5 | 30 days supply | Qty: 30 | Fill #0

## 2017-07-03 NOTE — Patient Instructions (Signed)
Obesity, Adult Obesity is having too much body fat. If you have a BMI of 30 or more, you are obese. BMI is a number that explains how much body fat you have. Obesity is often caused by taking in (consuming) more calories than your body uses. Obesity can cause serious health problems. Changing your lifestyle can help to treat obesity. Follow these instructions at home: Eating and drinking   Follow advice from your doctor about what to eat and drink. Your doctor may tell you to: ? Cut down on (limit) fast foods, sweets, and processed snack foods. ? Choose low-fat options. For example, choose low-fat milk instead of whole milk. ? Eat 5 or more servings of fruits or vegetables every day. ? Eat at home more often. This gives you more control over what you eat. ? Choose healthy foods when you eat out. ? Learn what a healthy portion size is. A portion size is the amount of a certain food that is healthy for you to eat at one time. This is different for each person. ? Keep low-fat snacks available. ? Avoid sugary drinks. These include soda, fruit juice, iced tea that is sweetened with sugar, and flavored milk. ? Eat a healthy breakfast.  Drink enough water to keep your pee (urine) clear or pale yellow.  Do not go without eating for long periods of time (do not fast).  Do not go on popular or trendy diets (fad diets). Physical Activity  Exercise often, as told by your doctor. Ask your doctor: ? What types of exercise are safe for you. ? How often you should exercise.  Warm up and stretch before being active.  Do slow stretching after being active (cool down).  Rest between times of being active. Lifestyle  Limit how much time you spend in front of your TV, computer, or video game system (be less sedentary).  Find ways to reward yourself that do not involve food.  Limit alcohol intake to no more than 1 drink a day for nonpregnant women and 2 drinks a day for men. One drink equals 12 oz  of beer, 5 oz of wine, or 1 oz of hard liquor. General instructions  Keep a weight loss journal. This can help you keep track of: ? The food that you eat. ? The exercise that you do.  Take over-the-counter and prescription medicines only as told by your doctor.  Take vitamins and supplements only as told by your doctor.  Think about joining a support group. Your doctor may be able to help with this.  Keep all follow-up visits as told by your doctor. This is important. Contact a doctor if:  You cannot meet your weight loss goal after you have changed your diet and lifestyle for 6 weeks. This information is not intended to replace advice given to you by your health care provider. Make sure you discuss any questions you have with your health care provider. Document Released: 04/29/2011 Document Revised: 07/13/2015 Document Reviewed: 11/23/2014 [employer] Interactive Patient Education  2018 [employer]  
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
## 2017-07-03 NOTE — Telephone Encounter (Signed)
Pharmacy called to request a change in pt's medication  -varenicline Pharmacy states the directions need to be fixed -Take one 0.5 mg tab by mouth once a day for 3 days then increase to one 0.5 mg tab daily for 4 days then increase to one 1 mg tab 2x a day- There is no increase in dosage Please follow up

## 2017-07-06 ENCOUNTER — Other Ambulatory Visit: Payer: Self-pay | Admitting: Nurse Practitioner

## 2017-07-06 DIAGNOSIS — F17200 Nicotine dependence, unspecified, uncomplicated: Secondary | ICD-10-CM

## 2017-07-06 MED ORDER — VARENICLINE TARTRATE 0.5 MG X 11 & 1 MG X 42 PO MISC: ORAL | 0 refills | Status: DC

## 2017-07-06 NOTE — Telephone Encounter (Signed)
Order has been resent with specific instructions for starter pack

## 2017-07-08 ENCOUNTER — Other Ambulatory Visit: Payer: Self-pay | Admitting: Nurse Practitioner

## 2017-07-09 MED FILL — VENTOLIN HFA 90 MCG INHALER: 108 (90 BAS | 25 days supply | Qty: 18 | Fill #0

## 2017-08-11 DIAGNOSIS — Z01419 Encounter for gynecological examination (general) (routine) without abnormal findings: Secondary | ICD-10-CM | POA: Diagnosis not present

## 2017-08-11 DIAGNOSIS — Z113 Encounter for screening for infections with a predominantly sexual mode of transmission: Secondary | ICD-10-CM | POA: Diagnosis not present

## 2017-08-11 DIAGNOSIS — Z124 Encounter for screening for malignant neoplasm of cervix: Secondary | ICD-10-CM | POA: Diagnosis not present

## 2017-08-22 ENCOUNTER — Ambulatory Visit: Payer: 59 | Admitting: Nurse Practitioner

## 2017-09-15 ENCOUNTER — Telehealth: Payer: 59 | Admitting: Family

## 2017-09-15 DIAGNOSIS — N3001 Acute cystitis with hematuria: Secondary | ICD-10-CM | POA: Diagnosis not present

## 2017-09-15 MED ORDER — CIPROFLOXACIN HCL 500 MG PO TABS: 500.0000 mg | ORAL_TABLET | Freq: Two times a day (BID) | ORAL | 0 refills | Status: DC

## 2017-09-15 MED ORDER — FLUCONAZOLE 150 MG PO TABS: 150.0000 mg | ORAL_TABLET | Freq: Once | ORAL | 0 refills | Status: AC

## 2017-09-15 MED FILL — FLUCONAZOLE 150 MG TABS: 150 | 1 days supply | Qty: 1 | Fill #0

## 2017-09-15 MED FILL — CIPROFLOXACIN HCL 500 MG TA: 500 | 5 days supply | Qty: 10 | Fill #0

## 2017-09-15 NOTE — Progress Notes (Signed)
We are sorry that you are not feeling well.  Here is how we plan to help!  Based on what you shared with me it looks like you most likely have a simple urinary tract infection.  A UTI (Urinary Tract Infection) is a bacterial infection of the bladder.  Most cases of urinary tract infections are simple to treat but a key part of your care is to encourage you to drink plenty of fluids and watch your symptoms carefully.  I have prescribed Ciprofloxacin 500 mg twice a day for 5 days.  Your symptoms should gradually improve. Call us if the burning in your urine worsens, you develop worsening fever, back pain or pelvic pain or if your symptoms do not resolve after completing the antibiotic.  I have also sent in diflucan prescription sent to your pharmacy.   Urinary tract infections can be prevented by drinking plenty of water to keep your body hydrated.  Also be sure when you wipe, wipe from front to back and don't hold it in!  If possible, empty your bladder every 4 hours.  Your e-visit answers were reviewed by a board certified advanced clinical practitioner to complete your personal care plan.  Depending on the condition, your plan could have included both over the counter or prescription medications.  If there is a problem please reply  once you have received a response from your provider.  Your safety is important to [place].  If you have drug allergies check your prescription carefully.    You can use MyChart to ask questions about today's visit, request a non-urgent call back, or ask for a work or school excuse for 24 hours related to this e-Visit. If it has been greater than 24 hours you will need to follow up with your provider, or enter a new e-Visit to address those concerns.   You will get an e-mail in the next two days asking about your experience.  I hope that your e-visit has been valuable and will speed your recovery. Thank you for using e-visits.

## 2017-09-30 MED FILL — PHENTERMINE 37.5 MG TABLET: 37.5 | 30 days supply | Qty: 30 | Fill #1

## 2017-11-10 ENCOUNTER — Other Ambulatory Visit: Payer: Self-pay | Admitting: Nurse Practitioner

## 2017-11-10 DIAGNOSIS — E669 Obesity, unspecified: Secondary | ICD-10-CM

## 2017-11-10 MED ORDER — PHENTERMINE HCL 37.5 MG PO TABS: 37.5000 mg | ORAL_TABLET | Freq: Every day | ORAL | 1 refills | Status: DC

## 2017-11-10 MED FILL — PHENTERMINE 37.5 MG TABLET: 37.5 | 30 days supply | Qty: 30 | Fill #0

## 2017-11-25 ENCOUNTER — Other Ambulatory Visit: Payer: Self-pay | Admitting: Nurse Practitioner

## 2017-11-25 DIAGNOSIS — F17200 Nicotine dependence, unspecified, uncomplicated: Secondary | ICD-10-CM

## 2017-11-25 MED ORDER — VARENICLINE TARTRATE 0.5 MG X 11 & 1 MG X 42 PO MISC: ORAL | 0 refills | Status: DC

## 2017-11-25 MED FILL — CHANTIX STARTING MONTH BOX: 0.5 MG X 11 | 28 days supply | Qty: 53 | Fill #0

## 2017-12-01 MED FILL — PHENTERMINE 37.5 MG TABLET: 37.5 | 30 days supply | Qty: 30 | Fill #0

## 2018-01-22 ENCOUNTER — Ambulatory Visit (INDEPENDENT_AMBULATORY_CARE_PROVIDER_SITE_OTHER): Payer: 59 | Admitting: *Deleted

## 2018-01-22 VITALS — BP 117/76 | HR 87 | Temp 98.1°F | Ht 65.0 in | Wt 207.0 lb

## 2018-01-22 DIAGNOSIS — Z113 Encounter for screening for infections with a predominantly sexual mode of transmission: Secondary | ICD-10-CM | POA: Diagnosis not present

## 2018-01-22 DIAGNOSIS — Z202 Contact with and (suspected) exposure to infections with a predominantly sexual mode of transmission: Secondary | ICD-10-CM

## 2018-01-22 DIAGNOSIS — N898 Other specified noninflammatory disorders of vagina: Secondary | ICD-10-CM | POA: Diagnosis not present

## 2018-01-23 LAB — URINE CYTOLOGY ANCILLARY ONLY
Chlamydia: NEGATIVE
NEISSERIA GONORRHEA: NEGATIVE
TRICH (WINDOWPATH): NEGATIVE

## 2018-01-23 NOTE — Progress Notes (Signed)
Patient ID: Karen Jordan, female   DOB: October 28, 1985, 32 y.o.   MRN: [id number] I have reviewed the chart and agree with nursing staff's documentation of this patient's encounter.  Laury Deep, CNM 01/23/2018 11:47 AM

## 2018-01-23 NOTE — Progress Notes (Signed)
   SUBJECTIVE:  32 y.o. female requesting a STD check for possible known exposure to STD. Denies abnormal vaginal bleeding, discharge or significant pelvic pain or fever. No UTI symptoms.   No LMP recorded. (Menstrual status: IUD).  OBJECTIVE:  She appears well, afebrile. Urine dipstick: not done.  ASSESSMENT:  Possible exposure to STD.  PLAN:  GC, chlamydia, trichomonas, BVAG, CVAG probe sent to lab. Treatment: To be determined once lab results are received.  Derl Barrow, RN

## 2018-01-26 LAB — URINE CYTOLOGY ANCILLARY ONLY
Bacterial vaginitis: NEGATIVE
CANDIDA VAGINITIS: NEGATIVE

## 2018-03-30 MED FILL — PHENTERMINE 37.5 MG TABLET: 37.5 | 30 days supply | Qty: 30 | Fill #1

## 2018-04-13 ENCOUNTER — Ambulatory Visit (INDEPENDENT_AMBULATORY_CARE_PROVIDER_SITE_OTHER): Payer: 59 | Admitting: Primary Care

## 2018-04-13 ENCOUNTER — Other Ambulatory Visit (HOSPITAL_COMMUNITY)
Admission: RE | Admit: 2018-04-13 | Discharge: 2018-04-13 | Disposition: A | Discharge status: Home / Self Care | Payer: 59 | Source: Ambulatory Visit | Attending: Primary Care | Admitting: Primary Care

## 2018-04-13 ENCOUNTER — Encounter (INDEPENDENT_AMBULATORY_CARE_PROVIDER_SITE_OTHER): Payer: Self-pay | Admitting: Primary Care

## 2018-04-13 VITALS — Ht 65.0 in

## 2018-04-13 DIAGNOSIS — F17200 Nicotine dependence, unspecified, uncomplicated: Secondary | ICD-10-CM

## 2018-04-13 DIAGNOSIS — D500 Iron deficiency anemia secondary to blood loss (chronic): Secondary | ICD-10-CM

## 2018-04-13 DIAGNOSIS — N898 Other specified noninflammatory disorders of vagina: Secondary | ICD-10-CM | POA: Diagnosis not present

## 2018-04-13 DIAGNOSIS — E669 Obesity, unspecified: Secondary | ICD-10-CM | POA: Diagnosis not present

## 2018-04-13 NOTE — Patient Instructions (Signed)
Obesity, Adult Obesity is having too much body fat. If you have a BMI of 30 or more, you are obese. BMI is a number that explains how much body fat you have. Obesity is often caused by taking in (consuming) more calories than your body uses. Obesity can cause serious health problems. Changing your lifestyle can help to treat obesity. Follow these instructions at home: Eating and drinking   Follow advice from your doctor about what to eat and drink. Your doctor may tell you to: ? Cut down on (limit) fast foods, sweets, and processed snack foods. ? Choose low-fat options. For example, choose low-fat milk instead of whole milk. ? Eat 5 or more servings of fruits or vegetables every day. ? Eat at home more often. This gives you more control over what you eat. ? Choose healthy foods when you eat out. ? Learn what a healthy portion size is. A portion size is the amount of a certain food that is healthy for you to eat at one time. This is different for each person. ? Keep low-fat snacks available. ? Avoid sugary drinks. These include soda, fruit juice, iced tea that is sweetened with sugar, and flavored milk. ? Eat a healthy breakfast.  Drink enough water to keep your pee (urine) clear or pale yellow.  Do not go without eating for long periods of time (do not fast).  Do not go on popular or trendy diets (fad diets). Physical Activity  Exercise often, as told by your doctor. Ask your doctor: ? What types of exercise are safe for you. ? How often you should exercise.  Warm up and stretch before being active.  Do slow stretching after being active (cool down).  Rest between times of being active. Lifestyle  Limit how much time you spend in front of your TV, computer, or video game system (be less sedentary).  Find ways to reward yourself that do not involve food.  Limit alcohol intake to no more than 1 drink a day for nonpregnant women and 2 drinks a day for men. One drink equals 12 oz  of beer, 5 oz of wine, or 1 oz of hard liquor. General instructions  Keep a weight loss journal. This can help you keep track of: ? The food that you eat. ? The exercise that you do.  Take over-the-counter and prescription medicines only as told by your doctor.  Take vitamins and supplements only as told by your doctor.  Think about joining a support group. Your doctor may be able to help with this.  Keep all follow-up visits as told by your doctor. This is important. Contact a doctor if:  You cannot meet your weight loss goal after you have changed your diet and lifestyle for 6 weeks. This information is not intended to replace advice given to you by your health care provider. Make sure you discuss any questions you have with your health care provider. Document Released: 04/29/2011 Document Revised: 07/13/2015 Document Reviewed: 11/23/2014 Elsevier Interactive Patient Education  2019 [employer]vier Inc.  
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
## 2018-04-14 DIAGNOSIS — D500 Iron deficiency anemia secondary to blood loss (chronic): Secondary | ICD-10-CM | POA: Diagnosis not present

## 2018-04-14 DIAGNOSIS — N898 Other specified noninflammatory disorders of vagina: Secondary | ICD-10-CM | POA: Diagnosis not present

## 2018-04-15 LAB — CBC WITH DIFFERENTIAL/PLATELET
BASOS ABS: 0 10*3/uL (ref 0.0–0.2)
Basos: 0 %
EOS (ABSOLUTE): 0.1 10*3/uL (ref 0.0–0.4)
Eos: 1 %
HEMOGLOBIN: 14 g/dL (ref 11.1–15.9)
Hematocrit: 39.2 % (ref 34.0–46.6)
Immature Grans (Abs): 0 10*3/uL (ref 0.0–0.1)
Immature Granulocytes: 0 %
LYMPHS ABS: 1.9 10*3/uL (ref 0.7–3.1)
Lymphs: 24 %
MCH: 32.8 pg (ref 26.6–33.0)
MCHC: 35.7 g/dL (ref 31.5–35.7)
MCV: 92 fL (ref 79–97)
Monocytes Absolute: 0.5 10*3/uL (ref 0.1–0.9)
Monocytes: 7 %
NEUTROS ABS: 5.3 10*3/uL (ref 1.4–7.0)
Neutrophils: 68 %
PLATELETS: 363 10*3/uL (ref 150–450)
RBC: 4.27 x10E6/uL (ref 3.77–5.28)
RDW: 11.9 % (ref 11.7–15.4)
WBC: 7.7 10*3/uL (ref 3.4–10.8)

## 2018-04-15 LAB — URINE CYTOLOGY ANCILLARY ONLY
Bacterial vaginitis: POSITIVE — AB
CANDIDA VAGINITIS: NEGATIVE
CHLAMYDIA, DNA PROBE: NEGATIVE
NEISSERIA GONORRHEA: NEGATIVE
Trichomonas: NEGATIVE

## 2018-04-15 LAB — HIV ANTIBODY (ROUTINE TESTING W REFLEX): HIV Screen 4th Generation wRfx: NONREACTIVE

## 2018-04-15 LAB — LIPID PANEL W/O CHOL/HDL RATIO
Cholesterol, Total: 126 mg/dL (ref 100–199)
HDL: 47 mg/dL (ref >39)
LDL CALC: 65 mg/dL (ref 0–99)
TRIGLYCERIDES: 72 mg/dL (ref 0–149)
VLDL Cholesterol Cal: 14 mg/dL (ref 5–40)

## 2018-06-08 ENCOUNTER — Other Ambulatory Visit: Payer: Self-pay | Admitting: Nurse Practitioner

## 2018-06-08 DIAGNOSIS — E669 Obesity, unspecified: Secondary | ICD-10-CM

## 2018-06-08 MED ORDER — PHENTERMINE HCL 37.5 MG PO TABS: 37.5000 mg | ORAL_TABLET | Freq: Every day | ORAL | 1 refills | Status: DC

## 2018-06-24 ENCOUNTER — Other Ambulatory Visit: Payer: Self-pay | Admitting: Nurse Practitioner

## 2018-06-24 DIAGNOSIS — E669 Obesity, unspecified: Secondary | ICD-10-CM

## 2018-06-24 MED ORDER — PHENTERMINE HCL 37.5 MG PO TABS: 37.5000 mg | ORAL_TABLET | Freq: Every day | ORAL | 1 refills | Status: DC

## 2018-07-06 MED FILL — PHENTERMINE 37.5 MG TABLET: 37.5 | 30 days supply | Qty: 30 | Fill #0

## 2018-08-18 MED FILL — PHENTERMINE 37.5 MG TABLET: 37.5 | 30 days supply | Qty: 30 | Fill #1

## 2018-10-07 ENCOUNTER — Ambulatory Visit (INDEPENDENT_AMBULATORY_CARE_PROVIDER_SITE_OTHER): Payer: 59 | Admitting: Nurse Practitioner

## 2018-10-07 ENCOUNTER — Other Ambulatory Visit: Payer: Self-pay

## 2018-10-07 ENCOUNTER — Encounter: Payer: Self-pay | Admitting: Nurse Practitioner

## 2018-10-07 VITALS — BP 123/88 | HR 97 | Temp 97.5°F | Resp 17 | Wt 201.6 lb

## 2018-10-07 DIAGNOSIS — Z0100 Encounter for examination of eyes and vision without abnormal findings: Secondary | ICD-10-CM

## 2018-10-07 DIAGNOSIS — Z0001 Encounter for general adult medical examination with abnormal findings: Secondary | ICD-10-CM | POA: Diagnosis not present

## 2018-10-07 DIAGNOSIS — M25511 Pain in right shoulder: Secondary | ICD-10-CM

## 2018-10-07 DIAGNOSIS — M25512 Pain in left shoulder: Secondary | ICD-10-CM | POA: Diagnosis not present

## 2018-10-07 DIAGNOSIS — E669 Obesity, unspecified: Secondary | ICD-10-CM | POA: Diagnosis not present

## 2018-10-07 DIAGNOSIS — N62 Hypertrophy of breast: Secondary | ICD-10-CM

## 2018-10-07 DIAGNOSIS — M79644 Pain in right finger(s): Secondary | ICD-10-CM | POA: Diagnosis not present

## 2018-10-07 DIAGNOSIS — Z0000 Encounter for general adult medical examination without abnormal findings: Secondary | ICD-10-CM

## 2018-10-07 MED ORDER — PHENTERMINE HCL 37.5 MG PO TABS
37.5000 mg | ORAL_TABLET | Freq: Every day | ORAL | 2 refills | Status: DC
Start: 2018-10-07 — End: 2019-03-10

## 2018-10-07 MED FILL — PHENTERMINE 37.5 MG TABLET: 37.5 | 30 days supply | Qty: 30 | Fill #0

## 2018-10-07 NOTE — Patient Instructions (Addendum)
Keeping You Healthy  Get These Tests 1. Blood Pressure- Have your blood pressure checked once a year by your health care provider.  Normal blood pressure is 120/80. 2. Weight- Have your body mass index (BMI) calculated to screen for obesity.  BMI is measure of body fat based on height and weight.  You can also calculate your own BMI at GravelBags.it. 3. Cholesterol- Have your cholesterol checked every 5 years starting at age 31 then yearly starting at age 57. 80. Chlamydia, HIV, and other sexually transmitted diseases- Get screened every year until age 30, then within three months of each new sexual provider. 5. Pap Test - Every 1-5 years; discuss with your health care provider. 6. Mammogram- Every 1-2 years starting at age 73--50  Take these medicines  Calcium with Vitamin D-Your body needs 1200 mg of Calcium each day and [phone] IU of Vitamin D daily.  Your body can only absorb 500 mg of Calcium at a time so Calcium must be taken in 2 or 3 divided doses throughout the day.  Multivitamin with folic acid- Once daily if it is possible for you to become pregnant.  Get these Immunizations  Gardasil-Series of three doses; prevents HPV related illness such as genital warts and cervical cancer.  Menactra-Single dose; prevents meningitis.  Tetanus shot- Every 10 years.  Flu shot-Every year.  Take these steps 1. Do not smoke-Your healthcare provider can help you quit.  For tips on how to quit go to www.smokefree.gov or call 1-800 QUITNOW. 2. Be physically active- Exercise 5 days a week for at least 30 minutes.  If you are not already physically active, start slow and gradually work up to 30 minutes of moderate physical activity.  Examples of moderate activity include walking briskly, dancing, swimming, bicycling, etc. 3. Breast Cancer- A self breast exam every month is important for early detection of breast cancer.  For more information and instruction on self breast exams, ask your  healthcare provider or https://www.patel.info/. 4. Eat a healthy diet- Eat a variety of healthy foods such as fruits, vegetables, whole grains, low fat milk, low fat cheeses, yogurt, lean meats, poultry and fish, beans, nuts, tofu, etc.  For more information go to www. Thenutritionsource.org 5. Drink alcohol in moderation- Limit alcohol intake to one drink or less per day. Never drink and drive. 6. Depression- Your emotional health is as important as your physical health.  If you're feeling down or losing interest in things you normally enjoy please talk to your healthcare provider about being screened for depression. 7. Dental visit- Brush and floss your teeth twice daily; visit your dentist twice a year. 8. Eye doctor- Get an eye exam at least every 2 years. 9. Helmet use- Always wear a helmet when riding a bicycle, motorcycle, rollerblading or skateboarding. 56. Safe sex- If you may be exposed to sexually transmitted infections, use a condom. 11. Seat belts- Seat belts can save your live; always wear one. 12. Smoke/Carbon Monoxide detectors- These detectors need to be installed on the appropriate level of your home. Replace batteries at least once a year. 13. Skin cancer- When out in the sun please cover up and use sunscreen 15 SPF or higher. 14. Violence- If anyone is threatening or hurting you, please tell your healthcare provider.        Steps to Quit Smoking Smoking tobacco is the leading cause of preventable death. It can affect almost every organ in the body. Smoking puts you and those around you at risk  for developing many serious chronic diseases. Quitting smoking can be difficult, but it is one of the best things that you can do for your health. It is never too late to quit. How do I get ready to quit? When you decide to quit smoking, create a plan to help you succeed. Before you quit:  Pick a date to quit. Set a date within the next 2 weeks to give you time  to prepare.  Write down the reasons why you are quitting. Keep this list in places where you will see it often.  Tell your family, friends, and co-workers that you are quitting. Support from your loved ones can make quitting easier.  Talk with your health care provider about your options for quitting smoking.  Find out what treatment options are covered by your health insurance.  Identify people, places, things, and activities that make you want to smoke (triggers). Avoid them. What first steps can I take to quit smoking?  Throw away all cigarettes at home, at work, and in your car.  Throw away smoking accessories, such as Scientist, research (medical).  Clean your car. Make sure to empty the ashtray.  Clean your home, including curtains and carpets. What strategies can I use to quit smoking? Talk with your health care provider about combining strategies, such as taking medicines while you are also receiving in-person counseling. Using these two strategies together makes you more likely to succeed in quitting than if you used either strategy on its own.  If you are pregnant or breastfeeding, talk with your health care provider about finding counseling or other support strategies to quit smoking. Do not take medicine to help you quit smoking unless your health care provider tells you to do so. To quit smoking: Quit right away  Quit smoking completely, instead of gradually reducing how much you smoke over a period of time. Research shows that stopping smoking right away is more successful than gradually quitting.  Attend in-person counseling to help you build problem-solving skills. You are more likely to succeed in quitting if you attend counseling sessions regularly. Even short sessions of 10 minutes can be effective. Take medicine You may take medicines to help you quit smoking. Some medicines require a prescription and some you can purchase over-the-counter. Medicines may have nicotine in  them to replace the nicotine in cigarettes. Medicines may:  Help to stop cravings.  Help to relieve withdrawal symptoms. Your health care provider may recommend:  Nicotine patches, gum, or lozenges.  Nicotine inhalers or sprays.  Non-nicotine medicine that is taken by mouth. Find resources Find resources and support systems that can help you to quit smoking and remain smoke-free after you quit. These resources are most helpful when you use them often. They include:  Online chats with a Social worker.  Telephone quitlines.  Printed Furniture conservator/restorer.  Support groups or group counseling.  Text messaging programs.  Mobile phone apps or applications. Use apps that can help you stick to your quit plan by providing reminders, tips, and encouragement. There are many free apps for mobile devices as well as websites. Examples include Quit Guide from the [employer] and smokefree.gov What things can I do to make it easier to quit?   Reach out to your family and friends for support and encouragement. Call telephone quitlines (1-800-QUIT-NOW), reach out to support groups, or work with a counselor for support.  Ask people who smoke to avoid smoking around you.  Avoid places that trigger you to smoke, such  as bars, parties, or smoke-break areas at work.  Spend time with people who do not smoke.  Lessen the stress in your life. Stress can be a smoking trigger for some people. To lessen stress, try: ? Exercising regularly. ? Doing deep-breathing exercises. ? Doing yoga. ? Meditating. ? Performing a body scan. This involves closing your eyes, scanning your body from head to toe, and noticing which parts of your body are particularly tense. Try to relax the muscles in those areas. How will I feel when I quit smoking? Day 1 to 3 weeks Within the first 24 hours of quitting smoking, you may start to feel withdrawal symptoms. These symptoms are usually most noticeable 2-3 days after quitting, but they  usually do not last for more than 2-3 weeks. You may experience these symptoms:  Mood swings.  Restlessness, anxiety, or irritability.  Trouble concentrating.  Dizziness.  Strong cravings for sugary foods and nicotine.  Mild weight gain.  Constipation.  Nausea.  Coughing or a sore throat.  Changes in how the medicines that you take for unrelated issues work in your body.  Depression.  Trouble sleeping (insomnia). Week 3 and afterward After the first 2-3 weeks of quitting, you may start to notice more positive results, such as:  Improved sense of smell and taste.  Decreased coughing and sore throat.  Slower heart rate.  Lower blood pressure.  Clearer skin.  The ability to breathe more easily.  Fewer sick days. Quitting smoking can be very challenging. Do not get discouraged if you are not successful the first time. Some people need to make many attempts to quit before they achieve long-term success. Do your best to stick to your quit plan, and talk with your health care provider if you have any questions or concerns. Summary  Smoking tobacco is the leading cause of preventable death. Quitting smoking is one of the best things that you can do for your health.  When you decide to quit smoking, create a plan to help you succeed.  Quit smoking right away, not slowly over a period of time.  When you start quitting, seek help from your health care provider, family, or friends. This information is not intended to replace advice given to you by your health care provider. Make sure you discuss any questions you have with your health care provider. Document Released: 01/29/2001 Document Revised: 04/24/2018 Document Reviewed: 04/25/2018 Elsevier Patient Education  2020 [employer].
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
## 2018-10-08 LAB — CMP14+EGFR
ALT: 15 IU/L (ref 0–32)
AST: 17 IU/L (ref 0–40)
Albumin/Globulin Ratio: 1.8 (ref 1.2–2.2)
Albumin: 4.5 g/dL (ref 3.8–4.8)
Alkaline Phosphatase: 52 IU/L (ref 39–117)
BUN/Creatinine Ratio: 11 (ref 9–23)
BUN: 9 mg/dL (ref 6–20)
Bilirubin Total: 0.4 mg/dL (ref 0.0–1.2)
CO2: 19 mmol/L — ABNORMAL LOW (ref 20–29)
Calcium: 9.2 mg/dL (ref 8.7–10.2)
Chloride: 103 mmol/L (ref 96–106)
Creatinine, Ser: 0.8 mg/dL (ref 0.57–1.00)
GFR calc Af Amer: 114 mL/min/{1.73_m2} (ref >59)
GFR calc non Af Amer: 99 mL/min/{1.73_m2} (ref >59)
Globulin, Total: 2.5 g/dL (ref 1.5–4.5)
Glucose: 88 mg/dL (ref 65–99)
Potassium: 4.2 mmol/L (ref 3.5–5.2)
Sodium: 136 mmol/L (ref 134–144)
Total Protein: 7 g/dL (ref 6.0–8.5)

## 2018-10-08 LAB — URIC ACID: Uric Acid: 4.5 mg/dL (ref 2.5–7.1)

## 2018-10-22 MED FILL — IBUPROFEN 600 MG TABLET: 600 | 5 days supply | Qty: 20 | Fill #0

## 2018-10-22 MED FILL — HYDROCODON-APAP 5-325: 5-325 | 5 days supply | Qty: 20 | Fill #0

## 2018-10-22 MED FILL — PHENTERMINE 37.5 MG TABLET: 37.5 | 30 days supply | Qty: 30 | Fill #0

## 2018-10-30 DIAGNOSIS — D3191 Benign neoplasm of unspecified part of right eye: Secondary | ICD-10-CM | POA: Diagnosis not present

## 2018-10-30 DIAGNOSIS — H0288B Meibomian gland dysfunction left eye, upper and lower eyelids: Secondary | ICD-10-CM | POA: Diagnosis not present

## 2018-10-30 DIAGNOSIS — Z124 Encounter for screening for malignant neoplasm of cervix: Secondary | ICD-10-CM | POA: Diagnosis not present

## 2018-10-30 DIAGNOSIS — H5213 Myopia, bilateral: Secondary | ICD-10-CM | POA: Diagnosis not present

## 2018-10-30 DIAGNOSIS — Z01419 Encounter for gynecological examination (general) (routine) without abnormal findings: Secondary | ICD-10-CM | POA: Diagnosis not present

## 2018-10-30 DIAGNOSIS — H52223 Regular astigmatism, bilateral: Secondary | ICD-10-CM | POA: Diagnosis not present

## 2018-10-30 DIAGNOSIS — H11153 Pinguecula, bilateral: Secondary | ICD-10-CM | POA: Diagnosis not present

## 2018-10-30 DIAGNOSIS — H0288A Meibomian gland dysfunction right eye, upper and lower eyelids: Secondary | ICD-10-CM | POA: Diagnosis not present

## 2018-10-30 DIAGNOSIS — R87610 Atypical squamous cells of undetermined significance on cytologic smear of cervix (ASC-US): Secondary | ICD-10-CM | POA: Diagnosis not present

## 2018-10-30 DIAGNOSIS — Z113 Encounter for screening for infections with a predominantly sexual mode of transmission: Secondary | ICD-10-CM | POA: Diagnosis not present

## 2018-10-30 DIAGNOSIS — H04123 Dry eye syndrome of bilateral lacrimal glands: Secondary | ICD-10-CM | POA: Diagnosis not present

## 2018-10-30 DIAGNOSIS — E0590 Thyrotoxicosis, unspecified without thyrotoxic crisis or storm: Secondary | ICD-10-CM | POA: Diagnosis not present

## 2018-10-30 DIAGNOSIS — H1045 Other chronic allergic conjunctivitis: Secondary | ICD-10-CM | POA: Diagnosis not present

## 2018-12-03 ENCOUNTER — Other Ambulatory Visit: Payer: Self-pay

## 2018-12-03 ENCOUNTER — Encounter (INDEPENDENT_AMBULATORY_CARE_PROVIDER_SITE_OTHER): Payer: Self-pay | Admitting: Primary Care

## 2018-12-03 ENCOUNTER — Other Ambulatory Visit (INDEPENDENT_AMBULATORY_CARE_PROVIDER_SITE_OTHER): Payer: Self-pay | Admitting: Primary Care

## 2018-12-03 ENCOUNTER — Ambulatory Visit (INDEPENDENT_AMBULATORY_CARE_PROVIDER_SITE_OTHER): Payer: 59 | Admitting: Primary Care

## 2018-12-03 VITALS — BP 127/84 | HR 98 | Temp 97.9°F | Resp 18

## 2018-12-03 DIAGNOSIS — W57XXXA Bitten or stung by nonvenomous insect and other nonvenomous arthropods, initial encounter: Secondary | ICD-10-CM | POA: Diagnosis not present

## 2018-12-03 DIAGNOSIS — S70361A Insect bite (nonvenomous), right thigh, initial encounter: Secondary | ICD-10-CM

## 2018-12-03 DIAGNOSIS — R21 Rash and other nonspecific skin eruption: Secondary | ICD-10-CM | POA: Diagnosis not present

## 2018-12-03 MED ORDER — DOXYCYCLINE HYCLATE 100 MG PO TABS: 100.0000 mg | ORAL_TABLET | Freq: Two times a day (BID) | ORAL | 0 refills | Status: DC

## 2018-12-03 MED FILL — DOXYCYCLINE HYCLATE 100 MG: 100 | 10 days supply | Qty: 20 | Fill #0
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
## 2018-12-04 ENCOUNTER — Other Ambulatory Visit (INDEPENDENT_AMBULATORY_CARE_PROVIDER_SITE_OTHER): Payer: Self-pay | Admitting: Primary Care

## 2018-12-04 ENCOUNTER — Encounter (INDEPENDENT_AMBULATORY_CARE_PROVIDER_SITE_OTHER): Payer: Self-pay | Admitting: Primary Care

## 2018-12-04 DIAGNOSIS — S70361A Insect bite (nonvenomous), right thigh, initial encounter: Secondary | ICD-10-CM | POA: Diagnosis not present

## 2018-12-04 DIAGNOSIS — W57XXXA Bitten or stung by nonvenomous insect and other nonvenomous arthropods, initial encounter: Secondary | ICD-10-CM | POA: Diagnosis not present

## 2018-12-04 MED ORDER — FLUCONAZOLE 150 MG PO TABS: 150.0000 mg | ORAL_TABLET | Freq: Once | ORAL | 2 refills | Status: AC

## 2018-12-04 MED FILL — FLUCONAZOLE 150 MG TABLET: 150 | 1 days supply | Qty: 1 | Fill #0

## 2018-12-04 MED FILL — PHENTERMINE 37.5 MG TABLET: 37.5 | 30 days supply | Qty: 30 | Fill #1

## 2018-12-04 NOTE — Addendum Note (Signed)
Addended by: Kerin Perna on: 12/04/2018 11:08 AM   Modules accepted: Orders

## 2018-12-09 LAB — ANAEROBIC AND AEROBIC CULTURE

## 2018-12-11 ENCOUNTER — Institutional Professional Consult (permissible substitution): Payer: 59 | Admitting: Plastic Surgery

## 2019-01-06 ENCOUNTER — Other Ambulatory Visit (INDEPENDENT_AMBULATORY_CARE_PROVIDER_SITE_OTHER): Payer: Self-pay | Admitting: Primary Care

## 2019-01-06 MED ORDER — CIPROFLOXACIN HCL 500 MG PO TABS
500.0000 mg | ORAL_TABLET | Freq: Two times a day (BID) | ORAL | 0 refills | Status: DC
Start: 2019-01-06 — End: 2019-03-12

## 2019-01-06 MED ORDER — PHENAZOPYRIDINE HCL 100 MG PO TABS
100.0000 mg | ORAL_TABLET | Freq: Three times a day (TID) | ORAL | 0 refills | Status: DC | PRN
Start: 2019-01-06 — End: 2019-03-12

## 2019-01-06 MED FILL — PHENAZOPYRIDINE 100 MG TAB: 100 | 3 days supply | Qty: 10 | Fill #0

## 2019-01-06 MED FILL — PHENTERMINE 37.5 MG TABLET: 37.5 | 30 days supply | Qty: 30 | Fill #2

## 2019-01-06 MED FILL — CIPROFLOXACIN HCL 500 MG TA: 500 | 3 days supply | Qty: 6 | Fill #0

## 2019-01-11 ENCOUNTER — Other Ambulatory Visit (HOSPITAL_COMMUNITY)
Admission: RE | Admit: 2019-01-11 | Discharge: 2019-01-11 | Disposition: A | Discharge status: Home / Self Care | Payer: 59 | Source: Ambulatory Visit | Attending: Primary Care | Admitting: Primary Care

## 2019-01-11 ENCOUNTER — Other Ambulatory Visit (INDEPENDENT_AMBULATORY_CARE_PROVIDER_SITE_OTHER): Payer: Self-pay | Admitting: Primary Care

## 2019-01-11 DIAGNOSIS — N898 Other specified noninflammatory disorders of vagina: Secondary | ICD-10-CM | POA: Diagnosis not present

## 2019-01-13 LAB — CERVICOVAGINAL ANCILLARY ONLY
Bacterial Vaginitis (gardnerella): NEGATIVE
Candida Glabrata: NEGATIVE
Candida Vaginitis: NEGATIVE
Chlamydia: NEGATIVE
Comment: NEGATIVE
Comment: NEGATIVE
Comment: NEGATIVE
Comment: NEGATIVE
Comment: NEGATIVE
Comment: NORMAL
Neisseria Gonorrhea: NEGATIVE
Trichomonas: NEGATIVE

## 2019-01-19 ENCOUNTER — Institutional Professional Consult (permissible substitution): Payer: 59 | Admitting: Plastic Surgery

## 2019-02-08 MED FILL — PHENTERMINE 37.5 MG TABLET: 37.5 | 30 days supply | Qty: 30 | Fill #2

## 2019-03-10 ENCOUNTER — Other Ambulatory Visit: Payer: Self-pay | Admitting: Nurse Practitioner

## 2019-03-10 DIAGNOSIS — E669 Obesity, unspecified: Secondary | ICD-10-CM

## 2019-03-12 ENCOUNTER — Other Ambulatory Visit (HOSPITAL_COMMUNITY)
Admission: RE | Admit: 2019-03-12 | Discharge: 2019-03-12 | Disposition: A | Discharge status: Home / Self Care | Payer: No Typology Code available for payment source | Source: Ambulatory Visit | Attending: Primary Care | Admitting: Primary Care

## 2019-03-12 ENCOUNTER — Encounter (INDEPENDENT_AMBULATORY_CARE_PROVIDER_SITE_OTHER): Payer: Self-pay | Admitting: Primary Care

## 2019-03-12 ENCOUNTER — Other Ambulatory Visit: Payer: Self-pay

## 2019-03-12 ENCOUNTER — Ambulatory Visit (INDEPENDENT_AMBULATORY_CARE_PROVIDER_SITE_OTHER): Payer: No Typology Code available for payment source | Admitting: Primary Care

## 2019-03-12 VITALS — BP 125/79 | HR 95 | Temp 98.1°F | Ht 64.0 in

## 2019-03-12 DIAGNOSIS — R58 Hemorrhage, not elsewhere classified: Secondary | ICD-10-CM | POA: Diagnosis not present

## 2019-03-12 DIAGNOSIS — Z7251 High risk heterosexual behavior: Secondary | ICD-10-CM

## 2019-03-12 DIAGNOSIS — D649 Anemia, unspecified: Secondary | ICD-10-CM

## 2019-03-12 DIAGNOSIS — R350 Frequency of micturition: Secondary | ICD-10-CM

## 2019-03-12 DIAGNOSIS — Z79899 Other long term (current) drug therapy: Secondary | ICD-10-CM

## 2019-03-12 LAB — POCT URINALYSIS DIP (CLINITEK)
Bilirubin, UA: NEGATIVE
Glucose, UA: NEGATIVE mg/dL
Ketones, POC UA: NEGATIVE mg/dL
Nitrite, UA: NEGATIVE
POC PROTEIN,UA: 30 — AB
Spec Grav, UA: 1.02
Urobilinogen, UA: 1 U/dL
pH, UA: 8.5 — AB
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
## 2019-03-14 LAB — CBC WITH DIFFERENTIAL/PLATELET
Basophils Absolute: 0 10*3/uL (ref 0.0–0.2)
Basos: 0 %
EOS (ABSOLUTE): 0.1 10*3/uL (ref 0.0–0.4)
Eos: 1 %
Hematocrit: 41.9 % (ref 34.0–46.6)
Hemoglobin: 14.5 g/dL (ref 11.1–15.9)
Immature Grans (Abs): 0 10*3/uL (ref 0.0–0.1)
Immature Granulocytes: 0 %
Lymphocytes Absolute: 2.1 10*3/uL (ref 0.7–3.1)
Lymphs: 22 %
MCH: 32.4 pg (ref 26.6–33.0)
MCHC: 34.6 g/dL (ref 31.5–35.7)
MCV: 94 fL (ref 79–97)
Monocytes Absolute: 0.6 10*3/uL (ref 0.1–0.9)
Monocytes: 6 %
Neutrophils Absolute: 6.8 10*3/uL (ref 1.4–7.0)
Neutrophils: 71 %
Platelets: 316 10*3/uL (ref 150–450)
RBC: 4.48 x10E6/uL (ref 3.77–5.28)
RDW: 12.3 % (ref 11.7–15.4)
WBC: 9.5 10*3/uL (ref 3.4–10.8)

## 2019-03-14 LAB — RPR: RPR Ser Ql: NONREACTIVE

## 2019-03-14 LAB — LIPID PANEL
Chol/HDL Ratio: 2.2 ratio (ref 0.0–4.4)
Cholesterol, Total: 174 mg/dL (ref 100–199)
HDL: 78 mg/dL (ref >39)
LDL Chol Calc (NIH): 85 mg/dL (ref 0–99)
Triglycerides: 56 mg/dL (ref 0–149)
VLDL Cholesterol Cal: 11 mg/dL (ref 5–40)

## 2019-03-14 LAB — URINE CULTURE: Organism ID, Bacteria: NO GROWTH

## 2019-03-14 MED ORDER — PHENTERMINE HCL 37.5 MG PO TABS: 37.5000 mg | ORAL_TABLET | Freq: Every day | ORAL | 2 refills | Status: DC

## 2019-03-15 MED FILL — PHENTERMINE 37.5 MG TABLET: 37.5 | 30 days supply | Qty: 30 | Fill #0

## 2019-03-16 ENCOUNTER — Other Ambulatory Visit (INDEPENDENT_AMBULATORY_CARE_PROVIDER_SITE_OTHER): Payer: Self-pay | Admitting: Primary Care

## 2019-03-16 LAB — CERVICOVAGINAL ANCILLARY ONLY
Bacterial Vaginitis (gardnerella): NEGATIVE
Chlamydia: NEGATIVE
Comment: NEGATIVE
Comment: NEGATIVE
Comment: NEGATIVE
Comment: NORMAL
Neisseria Gonorrhea: NEGATIVE
Trichomonas: POSITIVE — AB

## 2019-03-16 MED ORDER — FLUCONAZOLE 150 MG PO TABS: 150.0000 mg | ORAL_TABLET | Freq: Once | ORAL | 0 refills | Status: AC

## 2019-03-16 MED ORDER — SULFAMETHOXAZOLE-TRIMETHOPRIM 800-160 MG PO TABS: 1.0000 | ORAL_TABLET | Freq: Two times a day (BID) | ORAL | 0 refills | Status: DC

## 2019-03-16 MED FILL — SULFAMETHOXAZOLE-TMP DS TAB: 800-160 | 7 days supply | Qty: 14 | Fill #0

## 2019-03-16 MED FILL — FLUCONAZOLE 150 MG TABS: 150 | 3 days supply | Qty: 2 | Fill #0

## 2019-03-17 ENCOUNTER — Encounter (INDEPENDENT_AMBULATORY_CARE_PROVIDER_SITE_OTHER): Payer: Self-pay | Admitting: Primary Care

## 2019-03-17 NOTE — Progress Notes (Unsigned)
Reviewed labs treated with 2gms of flagyl  1 dose for trichomonas - no side affects tolerated well

## 2019-03-18 ENCOUNTER — Other Ambulatory Visit (INDEPENDENT_AMBULATORY_CARE_PROVIDER_SITE_OTHER): Payer: Self-pay | Admitting: Primary Care

## 2019-03-18 DIAGNOSIS — E669 Obesity, unspecified: Secondary | ICD-10-CM

## 2019-03-18 DIAGNOSIS — Z7251 High risk heterosexual behavior: Secondary | ICD-10-CM | POA: Diagnosis not present

## 2019-03-19 LAB — HSV(HERPES SIMPLEX VRS) I + II AB-IGG
HSV 1 Glycoprotein G Ab, IgG: 27.5 index — ABNORMAL HIGH (ref 0.00–0.90)
HSV 2 IgG, Type Spec: 6.46 index — ABNORMAL HIGH (ref 0.00–0.90)

## 2019-03-19 LAB — HIV ANTIBODY (ROUTINE TESTING W REFLEX): HIV Screen 4th Generation wRfx: NONREACTIVE

## 2019-03-29 MED FILL — PHENTERMINE 37.5 MG TABLET: 37.5 | 30 days supply | Qty: 30 | Fill #0

## 2019-03-29 MED FILL — SULFAMETHOXAZOLE-TMP DS TAB: 800-160 | 7 days supply | Qty: 14 | Fill #0

## 2019-04-12 ENCOUNTER — Other Ambulatory Visit (INDEPENDENT_AMBULATORY_CARE_PROVIDER_SITE_OTHER): Payer: Self-pay | Admitting: Primary Care

## 2019-04-12 ENCOUNTER — Other Ambulatory Visit (HOSPITAL_COMMUNITY)
Admission: RE | Admit: 2019-04-12 | Discharge: 2019-04-12 | Disposition: A | Discharge status: Home / Self Care | Payer: No Typology Code available for payment source | Source: Ambulatory Visit | Attending: Primary Care | Admitting: Primary Care

## 2019-04-12 DIAGNOSIS — N898 Other specified noninflammatory disorders of vagina: Secondary | ICD-10-CM | POA: Diagnosis present

## 2019-04-14 LAB — CERVICOVAGINAL ANCILLARY ONLY
Bacterial Vaginitis (gardnerella): POSITIVE — AB
Candida Glabrata: NEGATIVE
Candida Vaginitis: NEGATIVE
Chlamydia: NEGATIVE
Comment: NEGATIVE
Comment: NEGATIVE
Comment: NEGATIVE
Comment: NEGATIVE
Comment: NEGATIVE
Comment: NORMAL
Neisseria Gonorrhea: NEGATIVE
Trichomonas: NEGATIVE

## 2019-05-06 MED FILL — PHENTERMINE 37.5 MG TABLET: 37.5 | 30 days supply | Qty: 30 | Fill #1

## 2019-05-14 MED FILL — ETONOGESTREL-ETHINYL ESTRAD: 0.12-0.015 | 84 days supply | Qty: 3 | Fill #0

## 2019-06-15 ENCOUNTER — Other Ambulatory Visit (INDEPENDENT_AMBULATORY_CARE_PROVIDER_SITE_OTHER): Payer: Self-pay | Admitting: Primary Care

## 2019-06-15 ENCOUNTER — Other Ambulatory Visit (HOSPITAL_COMMUNITY)
Admission: RE | Admit: 2019-06-15 | Discharge: 2019-06-15 | Disposition: A | Discharge status: Home / Self Care | Payer: No Typology Code available for payment source | Source: Ambulatory Visit | Attending: Primary Care | Admitting: Primary Care

## 2019-06-15 DIAGNOSIS — N898 Other specified noninflammatory disorders of vagina: Secondary | ICD-10-CM | POA: Insufficient documentation

## 2019-06-16 ENCOUNTER — Other Ambulatory Visit (INDEPENDENT_AMBULATORY_CARE_PROVIDER_SITE_OTHER): Payer: Self-pay | Admitting: Primary Care

## 2019-06-16 LAB — CERVICOVAGINAL ANCILLARY ONLY
Bacterial Vaginitis (gardnerella): NEGATIVE
Candida Glabrata: NEGATIVE
Candida Vaginitis: POSITIVE — AB
Chlamydia: NEGATIVE
Comment: NEGATIVE
Comment: NEGATIVE
Comment: NEGATIVE
Comment: NEGATIVE
Comment: NEGATIVE
Comment: NORMAL
Neisseria Gonorrhea: NEGATIVE
Trichomonas: NEGATIVE

## 2019-06-16 MED ORDER — FLUCONAZOLE 150 MG PO TABS: 150.0000 mg | ORAL_TABLET | Freq: Once | ORAL | 0 refills | Status: AC

## 2019-06-16 MED FILL — FLUCONAZOLE 150 MG TABLET: 150 | 2 days supply | Qty: 2 | Fill #0

## 2019-07-20 MED FILL — PHENTERMINE 37.5 MG TABLET: 37.5 | 30 days supply | Qty: 30 | Fill #2

## 2019-07-20 MED FILL — FLUCONAZOLE 150 MG TABLET: 150 | 2 days supply | Qty: 2 | Fill #0

## 2019-07-21 ENCOUNTER — Other Ambulatory Visit (INDEPENDENT_AMBULATORY_CARE_PROVIDER_SITE_OTHER): Payer: Self-pay | Admitting: Primary Care

## 2019-07-21 ENCOUNTER — Other Ambulatory Visit (HOSPITAL_COMMUNITY)
Admission: RE | Admit: 2019-07-21 | Discharge: 2019-07-21 | Disposition: A | Discharge status: Home / Self Care | Payer: No Typology Code available for payment source | Source: Ambulatory Visit | Attending: Primary Care | Admitting: Primary Care

## 2019-07-21 DIAGNOSIS — N898 Other specified noninflammatory disorders of vagina: Secondary | ICD-10-CM | POA: Insufficient documentation

## 2019-07-22 LAB — CERVICOVAGINAL ANCILLARY ONLY
Bacterial Vaginitis (gardnerella): NEGATIVE
Candida Glabrata: NEGATIVE
Candida Vaginitis: POSITIVE — AB
Chlamydia: NEGATIVE
Comment: NEGATIVE
Comment: NEGATIVE
Comment: NEGATIVE
Comment: NEGATIVE
Comment: NEGATIVE
Comment: NORMAL
Neisseria Gonorrhea: NEGATIVE
Trichomonas: NEGATIVE

## 2019-08-13 MED FILL — AZITHROMYCIN 500 MG TABLET: 500 | 5 days supply | Qty: 5 | Fill #0

## 2019-08-13 MED FILL — predniSONE 20 MG TABS: 20 | 5 days supply | Qty: 10 | Fill #0

## 2019-08-13 MED FILL — BENZONATATE 100 MG CAPS: 100 | 10 days supply | Qty: 30 | Fill #0

## 2019-08-25 ENCOUNTER — Other Ambulatory Visit: Payer: Self-pay | Admitting: Nurse Practitioner

## 2019-08-25 DIAGNOSIS — E669 Obesity, unspecified: Secondary | ICD-10-CM

## 2019-08-25 MED ORDER — PHENTERMINE HCL 37.5 MG PO TABS: 37.5000 mg | ORAL_TABLET | Freq: Every day | ORAL | 2 refills | Status: DC

## 2019-08-25 MED FILL — PHENTERMINE 37.5 MG TABLET: 37.5 | 30 days supply | Qty: 30 | Fill #0

## 2019-09-22 ENCOUNTER — Other Ambulatory Visit (INDEPENDENT_AMBULATORY_CARE_PROVIDER_SITE_OTHER): Payer: Self-pay | Admitting: Primary Care

## 2019-09-22 ENCOUNTER — Other Ambulatory Visit (HOSPITAL_COMMUNITY)
Admission: RE | Admit: 2019-09-22 | Discharge: 2019-09-22 | Disposition: A | Discharge status: Home / Self Care | Payer: No Typology Code available for payment source | Source: Ambulatory Visit | Attending: Primary Care | Admitting: Primary Care

## 2019-09-22 DIAGNOSIS — N898 Other specified noninflammatory disorders of vagina: Secondary | ICD-10-CM | POA: Diagnosis not present

## 2019-09-26 LAB — CERVICOVAGINAL ANCILLARY ONLY
Bacterial Vaginitis (gardnerella): NEGATIVE
Candida Glabrata: NEGATIVE
Candida Vaginitis: NEGATIVE
Chlamydia: NEGATIVE
Comment: NEGATIVE
Comment: NEGATIVE
Comment: NEGATIVE
Comment: NEGATIVE
Comment: NEGATIVE
Comment: NORMAL
Neisseria Gonorrhea: NEGATIVE
Trichomonas: NEGATIVE

## 2019-10-06 ENCOUNTER — Encounter (HOSPITAL_COMMUNITY): Payer: Self-pay | Admitting: Psychiatry

## 2019-10-06 ENCOUNTER — Inpatient Hospital Stay (HOSPITAL_COMMUNITY)
Admission: RE | Admit: 2019-10-06 | Discharge: 2019-10-08 | DRG: 885 | Disposition: A | Discharge status: Home / Self Care | Payer: No Typology Code available for payment source | Attending: Psychiatry | Admitting: Psychiatry

## 2019-10-06 ENCOUNTER — Other Ambulatory Visit (HOSPITAL_COMMUNITY): Payer: Self-pay | Admitting: Family

## 2019-10-06 ENCOUNTER — Other Ambulatory Visit: Payer: Self-pay

## 2019-10-06 DIAGNOSIS — R45851 Suicidal ideations: Secondary | ICD-10-CM | POA: Diagnosis present

## 2019-10-06 DIAGNOSIS — F332 Major depressive disorder, recurrent severe without psychotic features: Principal | ICD-10-CM

## 2019-10-06 DIAGNOSIS — F17210 Nicotine dependence, cigarettes, uncomplicated: Secondary | ICD-10-CM | POA: Diagnosis present

## 2019-10-06 DIAGNOSIS — Z915 Personal history of self-harm: Secondary | ICD-10-CM | POA: Diagnosis not present

## 2019-10-06 DIAGNOSIS — Z20822 Contact with and (suspected) exposure to covid-19: Secondary | ICD-10-CM | POA: Diagnosis present

## 2019-10-06 DIAGNOSIS — F3342 Major depressive disorder, recurrent, in full remission: Secondary | ICD-10-CM

## 2019-10-06 DIAGNOSIS — Z833 Family history of diabetes mellitus: Secondary | ICD-10-CM | POA: Diagnosis not present

## 2019-10-06 DIAGNOSIS — Z881 Allergy status to other antibiotic agents status: Secondary | ICD-10-CM

## 2019-10-06 DIAGNOSIS — F330 Major depressive disorder, recurrent, mild: Secondary | ICD-10-CM

## 2019-10-06 DIAGNOSIS — F4310 Post-traumatic stress disorder, unspecified: Secondary | ICD-10-CM | POA: Diagnosis present

## 2019-10-06 HISTORY — DX: Anxiety disorder, unspecified: F41.9

## 2019-10-06 HISTORY — DX: Depression, unspecified: F32.A

## 2019-10-06 LAB — CBC
HCT: 39.3 % (ref 36.0–46.0)
Hemoglobin: 13.3 g/dL (ref 12.0–15.0)
MCH: 31.2 pg (ref 26.0–34.0)
MCHC: 33.8 g/dL (ref 30.0–36.0)
MCV: 92.3 fL (ref 80.0–100.0)
Platelets: 339 10*3/uL (ref 150–400)
RBC: 4.26 MIL/uL (ref 3.87–5.11)
RDW: 12.8 % (ref 11.5–15.5)
WBC: 4.8 10*3/uL (ref 4.0–10.5)
nRBC: 0 % (ref 0.0–0.2)

## 2019-10-06 LAB — TSH: TSH: 0.687 u[IU]/mL (ref 0.350–4.500)

## 2019-10-06 LAB — COMPREHENSIVE METABOLIC PANEL
ALT: 16 U/L (ref 0–44)
AST: 17 U/L (ref 15–41)
Albumin: 4.3 g/dL (ref 3.5–5.0)
Alkaline Phosphatase: 36 U/L — ABNORMAL LOW (ref 38–126)
Anion gap: 11 (ref 5–15)
BUN: 13 mg/dL (ref 6–20)
CO2: 20 mmol/L — ABNORMAL LOW (ref 22–32)
Calcium: 8.8 mg/dL — ABNORMAL LOW (ref 8.9–10.3)
Chloride: 108 mmol/L (ref 98–111)
Creatinine, Ser: 0.65 mg/dL (ref 0.44–1.00)
GFR calc Af Amer: >60 mL/min (ref >60)
GFR calc non Af Amer: >60 mL/min (ref >60)
Glucose, Bld: 84 mg/dL (ref 70–99)
Potassium: 3.8 mmol/L (ref 3.5–5.1)
Sodium: 139 mmol/L (ref 135–145)
Total Bilirubin: 0.4 mg/dL (ref 0.3–1.2)
Total Protein: 7.4 g/dL (ref 6.5–8.1)

## 2019-10-06 LAB — LIPID PANEL
Cholesterol: 177 mg/dL (ref 0–200)
HDL: 64 mg/dL (ref >40)
LDL Cholesterol: 91 mg/dL (ref 0–99)
Total CHOL/HDL Ratio: 2.8 RATIO
Triglycerides: 111 mg/dL (ref <150)
VLDL: 22 mg/dL (ref 0–40)

## 2019-10-06 LAB — SARS CORONAVIRUS 2 BY RT PCR (HOSPITAL ORDER, PERFORMED IN ~~LOC~~ HOSPITAL LAB): SARS Coronavirus 2: NEGATIVE

## 2019-10-06 LAB — HEMOGLOBIN A1C
Hgb A1c MFr Bld: 5 % (ref 4.8–5.6)
Mean Plasma Glucose: 96.8 mg/dL

## 2019-10-06 MED ORDER — HYDROXYZINE HCL 25 MG PO TABS
25.0000 mg | ORAL_TABLET | Freq: Three times a day (TID) | ORAL | Status: DC | PRN
  Filled 2019-10-06: qty 1

## 2019-10-06 MED ORDER — ACETAMINOPHEN 325 MG PO TABS: 650.0000 mg | ORAL_TABLET | Freq: Four times a day (QID) | ORAL | Status: DC | PRN

## 2019-10-06 MED ORDER — TRAZODONE HCL 50 MG PO TABS
50.0000 mg | ORAL_TABLET | Freq: Every evening | ORAL | Status: DC | PRN
  Filled 2019-10-06: qty 1

## 2019-10-06 MED ORDER — MAGNESIUM HYDROXIDE 400 MG/5ML PO SUSP: 30.0000 mL | Freq: Every day | ORAL | Status: DC | PRN

## 2019-10-06 MED ORDER — ESCITALOPRAM OXALATE 5 MG PO TABS
5.0000 mg | ORAL_TABLET | Freq: Every day | ORAL | Status: DC
  Administered 2019-10-06 – 2019-10-07 (×2): 5 mg via ORAL
  Filled 2019-10-06 (×5): qty 1

## 2019-10-06 MED ORDER — ALUM & MAG HYDROXIDE-SIMETH 200-200-20 MG/5ML PO SUSP: 30.0000 mL | ORAL | Status: DC | PRN
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
## 2019-10-06 NOTE — Plan of Care (Signed)
Nurse discussed anxiety, depression and coping skills with patient.  
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
## 2019-10-06 NOTE — Progress Notes (Signed)
Covid test collected. [facility] called for pick up. Specimen placed at front desk.
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
## 2019-10-06 NOTE — Progress Notes (Signed)
UA container given to patient.

## 2019-10-06 NOTE — Tx Team (Signed)
Initial Treatment Plan 10/06/2019 8:05 PM Karen Jordan FMB:[id number]    PATIENT STRESSORS: Financial difficulties Marital or family conflict Occupational concerns Substance abuse   PATIENT STRENGTHS: Ability for insight Active sense of humor Average or above average intelligence Capable of independent living Communication skills General fund of knowledge Motivation for treatment/growth Physical Health Supportive family/friends Work skills   PATIENT IDENTIFIED PROBLEMS: "anxiety"  "depression"  "alcohol"  "suicidal thoughts"               DISCHARGE CRITERIA:  Ability to meet basic life and health needs Improved stabilization in mood, thinking, and/or behavior Medical problems require only outpatient monitoring Motivation to continue treatment in a less acute level of care Need for constant or close observation no longer present Verbal commitment to aftercare and medication compliance  PRELIMINARY DISCHARGE PLAN: Attend aftercare/continuing care group Attend 12-step recovery group Return to previous living arrangement Return to previous work or school arrangements  PATIENT/FAMILY INVOLVEMENT: This treatment plan has been presented to and reviewed with the patient, Karen Jordan.  The patient and family have been given the opportunity to ask questions and make suggestions.  Grayland Ormond [place], [place] 10/06/2019, 8:05 PM

## 2019-10-07 MED ORDER — ESCITALOPRAM OXALATE 10 MG PO TABS
10.0000 mg | ORAL_TABLET | Freq: Every day | ORAL | Status: DC
  Administered 2019-10-08: 10 mg via ORAL
  Filled 2019-10-07 (×2): qty 1

## 2019-10-07 MED ORDER — ESCITALOPRAM OXALATE 5 MG PO TABS
5.0000 mg | ORAL_TABLET | Freq: Once | ORAL | Status: AC
  Administered 2019-10-07: 5 mg via ORAL
  Filled 2019-10-07: qty 1

## 2019-10-07 MED ORDER — PRAZOSIN HCL 1 MG PO CAPS
1.0000 mg | ORAL_CAPSULE | Freq: Every day | ORAL | Status: DC
  Administered 2019-10-07: 1 mg via ORAL
  Filled 2019-10-07 (×3): qty 1

## 2019-10-07 NOTE — Plan of Care (Signed)
Progress note  D: pt found in bed; compliant with medication administration. Pt is pleasant but presents anxious, sad, sullen, and depressed. Pt states they feel as though they have become too overwhelmed over the past week and had to come in. Pt states they know they have their son to live for, and must be strong for them. Pt also expresses the need to utilize their support systems more, and not try to take on so much themself being a single mom. Pt feels their home is safe to back to, and has no issues being alone right now. Pt denies si/hi/ah/vh and verbally agrees to approach staff if these become apparent or before harming themself/others while at [facility].  A: Pt provided support and encouragement. Pt given medication per protocol and standing orders. Q28m safety checks implemented and continued.  R: Pt safe on the unit. Will continue to monitor.  Pt progressing in the following metrics  Problem: Education: Goal: Knowledge of [place] [facility] Education information/materials will improve Outcome: Progressing   Problem: Education: Goal: Ability to state activities that reduce stress will improve Outcome: Progressing   Problem: Education: Goal: Ability to state activities that reduce stress will improve Outcome: Progressing   Problem: Education: Goal: Knowledge of [place] Education information/materials will improve Outcome: Progressing

## 2019-10-07 NOTE — [facility] Suicide Risk Assessment (Signed)
[facility] INPATIENT:  Family/Significant Other Suicide Prevention Education  Suicide Prevention Education:  Education Completed; Stefani Dama [phone],  (name of family member/significant other) has been identified by the patient as the family member/significant other with whom the patient will be residing, and identified as the person(s) who will aid the patient in the event of a mental health crisis (suicidal ideations/suicide attempt).  With written consent from the patient, the family member/significant other has been provided the following suicide prevention education, prior to the and/or following the discharge of the patient.  The suicide prevention education provided includes the following:  Suicide risk factors  Suicide prevention and interventions  National Suicide Hotline telephone number  [facility] assessment telephone number  [facility] Emergency Assistance [place] and/or Residential Mobile Crisis Unit telephone number  Request made of family/significant other to:  Remove weapons (e.g., guns, rifles, knives), all items previously/currently identified as safety concern.    Remove drugs/medications (over-the-counter, prescriptions, illicit drugs), all items previously/currently identified as a safety concern.  The family member/significant other verbalizes understanding of the suicide prevention education information provided.  The family member/significant other agrees to remove the items of safety concern listed above.  Bethann Berkshire 10/07/2019, 11:38 AM

## 2019-10-07 NOTE — Progress Notes (Signed)
Adult Psychoeducational Group Note  Date:  10/07/2019 Time:  6:12 AM  Group Topic/Focus:  Wrap-Up Group:   The focus of this group is to help patients review their daily goal of treatment and discuss progress on daily workbooks.  Participation Level:  Active  Participation Quality:  Appropriate  Affect:  Appropriate  Cognitive:  Appropriate  Insight: Appropriate  Engagement in Group:  Engaged  Modes of Intervention:  Discussion  Additional Comments:    Barbette Hair 10/07/2019, 6:12 AM
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
## 2019-10-07 NOTE — Progress Notes (Signed)
Psychoeducational Group Note  Date:  10/07/2019 Time: 2030  Group Topic/Focus:  wrap up group  Participation Level: Did Not Attend  Participation Quality:  Not Applicable  Affect:  Not Applicable  Cognitive:  Not Applicable  Insight:  Not Applicable  Engagement in Group: Not Applicable  Additional Comments:  Pt was notified that group was beginning but remained in her room.  Shellia Cleverly 10/07/2019, 9:26 PM

## 2019-10-07 NOTE — Progress Notes (Signed)
Pt has been isolative most of the evening, came out for a cup of ice and went back to her bed. Denied SI/HI and contracted for safety, will continue to monitor.

## 2019-10-08 MED ORDER — HYDROXYZINE HCL 25 MG PO TABS: 25.0000 mg | ORAL_TABLET | Freq: Three times a day (TID) | ORAL | 0 refills | Status: DC | PRN

## 2019-10-08 MED ORDER — PRAZOSIN HCL 1 MG PO CAPS: 1.0000 mg | ORAL_CAPSULE | Freq: Every day | ORAL | 0 refills | Status: DC

## 2019-10-08 MED ORDER — ESCITALOPRAM OXALATE 10 MG PO TABS
10.0000 mg | ORAL_TABLET | Freq: Every day | ORAL | 0 refills | Status: DC
Start: 2019-10-09 — End: 2019-11-18

## 2019-10-08 MED ORDER — TRAZODONE HCL 50 MG PO TABS: 50.0000 mg | ORAL_TABLET | Freq: Every evening | ORAL | 0 refills | Status: DC | PRN
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
## 2019-10-08 NOTE — Progress Notes (Signed)
Pt has been isolative, come out for snacks and medication then goes back to her room. Pt stated feeling better, reports good sleep, and good appetite. Pt denies SI/Hi and contracted for safety, will continue to monitor.

## 2019-10-08 NOTE — Tx Team (Signed)
Interdisciplinary Treatment and Diagnostic Plan Update  10/08/2019 Time of Session: 9:35am Karen Jordan MRN: [id number]  Principal Diagnosis: MDD (major depressive disorder), recurrent episode, severe ([place])  Secondary Diagnoses: Principal Problem:   MDD (major depressive disorder), recurrent episode, severe ([place])   Current Medications:  Current Facility-Administered Medications  Medication Dose Route Frequency Provider Last Rate Last Admin  . acetaminophen (TYLENOL) tablet 650 mg  650 mg Oral Q6H PRN Sharma Covert, MD      . alum & mag hydroxide-simeth (MAALOX/MYLANTA) 200-200-20 MG/5ML suspension 30 mL  30 mL Oral Q4H PRN Sharma Covert, MD      . escitalopram (LEXAPRO) tablet 10 mg  10 mg Oral Daily Sharma Covert, MD   10 mg at 10/08/19 0755  . hydrOXYzine (ATARAX/VISTARIL) tablet 25 mg  25 mg Oral TID PRN Sharma Covert, MD      . magnesium hydroxide (MILK OF MAGNESIA) suspension 30 mL  30 mL Oral Daily PRN Sharma Covert, MD      . prazosin (MINIPRESS) capsule 1 mg  1 mg Oral QHS Sharma Covert, MD   1 mg at 10/07/19 2106  . traZODone (DESYREL) tablet 50 mg  50 mg Oral QHS PRN Sharma Covert, MD       PTA Medications: No medications prior to admission.    Patient Stressors: Financial difficulties Marital or family conflict Occupational concerns Substance abuse  Patient Strengths: Ability for insight Active sense of humor Average or above average intelligence Capable of independent living Communication skills General fund of knowledge Motivation for treatment/growth Physical Health Supportive family/friends Work skills  Treatment Modalities: Medication Management, Group therapy, Case management,  1 to 1 session with clinician, Psychoeducation, Recreational therapy.   Physician Treatment Plan for Primary Diagnosis: MDD (major depressive disorder), recurrent episode, severe ([place]) Long Term Goal(s): Improvement in symptoms so as ready  for discharge Improvement in symptoms so as ready for discharge   Short Term Goals: Ability to identify changes in lifestyle to reduce recurrence of condition will improve Ability to verbalize feelings will improve Ability to disclose and discuss suicidal ideas Ability to demonstrate self-control will improve Ability to identify and develop effective coping behaviors will improve Ability to maintain clinical measurements within normal limits will improve Compliance with prescribed medications will improve Ability to identify triggers associated with substance abuse/mental health issues will improve Ability to identify changes in lifestyle to reduce recurrence of condition will improve Ability to verbalize feelings will improve Ability to disclose and discuss suicidal ideas Ability to demonstrate self-control will improve Ability to identify and develop effective coping behaviors will improve Ability to maintain clinical measurements within normal limits will improve Compliance with prescribed medications will improve Ability to identify triggers associated with substance abuse/mental health issues will improve  Medication Management: Evaluate patient's response, side effects, and tolerance of medication regimen.  Therapeutic Interventions: 1 to 1 sessions, Unit Group sessions and Medication administration.  Evaluation of Outcomes: Adequate for Discharge  Physician Treatment Plan for Secondary Diagnosis: Principal Problem:   MDD (major depressive disorder), recurrent episode, severe ([place])  Long Term Goal(s): Improvement in symptoms so as ready for discharge Improvement in symptoms so as ready for discharge   Short Term Goals: Ability to identify changes in lifestyle to reduce recurrence of condition will improve Ability to verbalize feelings will improve Ability to disclose and discuss suicidal ideas Ability to demonstrate self-control will improve Ability to identify and develop  effective coping behaviors will improve Ability to  maintain clinical measurements within normal limits will improve Compliance with prescribed medications will improve Ability to identify triggers associated with substance abuse/mental health issues will improve Ability to identify changes in lifestyle to reduce recurrence of condition will improve Ability to verbalize feelings will improve Ability to disclose and discuss suicidal ideas Ability to demonstrate self-control will improve Ability to identify and develop effective coping behaviors will improve Ability to maintain clinical measurements within normal limits will improve Compliance with prescribed medications will improve Ability to identify triggers associated with substance abuse/mental health issues will improve     Medication Management: Evaluate patient's response, side effects, and tolerance of medication regimen.  Therapeutic Interventions: 1 to 1 sessions, Unit Group sessions and Medication administration.  Evaluation of Outcomes: Adequate for Discharge   RN Treatment Plan for Primary Diagnosis: MDD (major depressive disorder), recurrent episode, severe ([place]) Long Term Goal(s): Knowledge of disease and therapeutic regimen to maintain health will improve  Short Term Goals: Ability to remain free from injury will improve, Ability to verbalize frustration and anger appropriately will improve, Ability to identify and develop effective coping behaviors will improve and Compliance with prescribed medications will improve  Medication Management: RN will administer medications as ordered by provider, will assess and evaluate patient's response and provide education to patient for prescribed medication. RN will report any adverse and/or side effects to prescribing provider.  Therapeutic Interventions: 1 on 1 counseling sessions, Psychoeducation, Medication administration, Evaluate responses to treatment, Monitor vital signs and  CBGs as ordered, Perform/monitor CIWA, COWS, AIMS and Fall Risk screenings as ordered, Perform wound care treatments as ordered.  Evaluation of Outcomes: Adequate for Discharge   LCSW Treatment Plan for Primary Diagnosis: MDD (major depressive disorder), recurrent episode, severe ([place]) Long Term Goal(s): Safe transition to appropriate next level of care at discharge, Engage patient in therapeutic group addressing interpersonal concerns.  Short Term Goals: Engage patient in aftercare planning with referrals and resources, Increase social support and Increase skills for wellness and recovery  Therapeutic Interventions: Assess for all discharge needs, 1 to 1 time with Social worker, Explore available resources and support systems, Assess for adequacy in community support network, Educate family and significant other(s) on suicide prevention, Complete Psychosocial Assessment, Interpersonal group therapy.  Evaluation of Outcomes: Adequate for Discharge   Progress in Treatment: Attending groups: No. Participating in groups: No. Taking medication as prescribed: Yes. Toleration medication: Yes. Family/Significant other contact made: Yes, individual(s) contacted:  with cousin Patient understands diagnosis: Yes. Discussing patient identified problems/goals with staff: Yes. Medical problems stabilized or resolved: Yes. Denies suicidal/homicidal ideation: Yes. Issues/concerns per patient self-inventory: No.  New problem(s) identified: No, Describe:  none.  New Short Term/Long Term Goal(s): medication stabilization, elimination of SI thoughts, development of comprehensive mental wellness plan.   Patient Goals:  "To manage to my feelings. Recognize when I'm feeling overwhelmed, be bale to ask for help, use coping skills"  Discharge Plan or Barriers: Is to follow up with [facility] for medication management and therapy.   Reason for Continuation of Hospitalization: Medication  stabilization  Estimated Length of Stay: Adequate for Discharge   Attendees: Patient: Karen Jordan 10/08/2019   Physician: Melba Coon, MD 10/08/2019   Nursing:  10/08/2019   RN Care Manager: 10/08/2019  Social Worker: Darletta Moll, [place] 10/08/2019   Recreational Therapist:  10/08/2019   Other: Armando Reichert, MD 10/08/2019   Other: Honor Junes, MD 10/08/2019  Other: 10/08/2019      Scribe for Treatment Team: Vassie Moselle, LCSW  10/08/2019 10:29 AM

## 2019-10-08 NOTE — Progress Notes (Signed)
  [facility] Adult Case Management Discharge Plan :  Will you be returning to the same living situation after discharge:  Yes,  to home. At discharge, do you have transportation home?: Yes,  cousin to pick patient up. Do you have the ability to pay for your medications: Yes,  has insurance.  Release of information consent forms completed and in the chart;  Patient's signature needed at discharge.  Patient to Follow up at:  Follow-up Information    [facility]. Go on 10/22/2019.   Specialty: Behavioral Health Why: You have in person therapy on Friday Sept, 3 at [place] 30 minutes early to complete paperwork. You also have a virtual medication management appointment on September 21 at [facility] information: [place] [phone]              Next level of care provider has access to [place] and Suicide Prevention discussed: Yes,  with cousin.     Has patient been referred to the Quitline?: N/A patient is not a smoker  Patient has been referred for addiction treatment: [place], LCSW 10/08/2019, 10:43 AM
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
## 2019-10-08 NOTE — Progress Notes (Signed)
Discharge Note:  Patient denies SI/HI AVH at this time. Discharge instructions, AVS, prescriptions and transition record gone over with patient. Patient agrees to comply with medication management, follow-up visit, and outpatient therapy. Patient belongings returned to patient. Patient questions and concerns addressed and answered.  Patient ambulatory off unit.  Patient discharged to home with friend.   

## 2019-10-11 MED FILL — ESCITALOPRAM 10 MG TABLET: 10 | 30 days supply | Qty: 30 | Fill #0

## 2019-10-11 MED FILL — PRAZOSIN 1 MG CAPSULE: 1 | 30 days supply | Qty: 30 | Fill #0

## 2019-10-11 MED FILL — traZODone HCL 50 MG TABS: 50 | 30 days supply | Qty: 30 | Fill #0

## 2019-10-11 MED FILL — hydrOXYzine HCL 25 MG TABS: 25 | 25 days supply | Qty: 75 | Fill #0

## 2019-10-22 ENCOUNTER — Ambulatory Visit (INDEPENDENT_AMBULATORY_CARE_PROVIDER_SITE_OTHER): Payer: No Typology Code available for payment source | Admitting: Licensed Clinical Social Worker

## 2019-10-22 ENCOUNTER — Other Ambulatory Visit: Payer: Self-pay

## 2019-10-22 DIAGNOSIS — F332 Major depressive disorder, recurrent severe without psychotic features: Secondary | ICD-10-CM | POA: Diagnosis not present

## 2019-10-22 DIAGNOSIS — F411 Generalized anxiety disorder: Secondary | ICD-10-CM

## 2019-10-22 DIAGNOSIS — F4310 Post-traumatic stress disorder, unspecified: Secondary | ICD-10-CM
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
## 2019-10-27 ENCOUNTER — Other Ambulatory Visit (INDEPENDENT_AMBULATORY_CARE_PROVIDER_SITE_OTHER): Payer: Self-pay | Admitting: Primary Care

## 2019-10-27 ENCOUNTER — Other Ambulatory Visit (HOSPITAL_COMMUNITY)
Admission: RE | Admit: 2019-10-27 | Discharge: 2019-10-27 | Disposition: A | Discharge status: Home / Self Care | Payer: No Typology Code available for payment source | Source: Ambulatory Visit | Attending: Primary Care | Admitting: Primary Care

## 2019-10-27 DIAGNOSIS — N898 Other specified noninflammatory disorders of vagina: Secondary | ICD-10-CM | POA: Diagnosis not present

## 2019-10-28 LAB — CERVICOVAGINAL ANCILLARY ONLY
Bacterial Vaginitis (gardnerella): NEGATIVE
Candida Glabrata: NEGATIVE
Candida Vaginitis: NEGATIVE
Chlamydia: NEGATIVE
Comment: NEGATIVE
Comment: NEGATIVE
Comment: NEGATIVE
Comment: NEGATIVE
Comment: NEGATIVE
Comment: NORMAL
Neisseria Gonorrhea: NEGATIVE
Trichomonas: NEGATIVE

## 2019-11-01 ENCOUNTER — Other Ambulatory Visit (INDEPENDENT_AMBULATORY_CARE_PROVIDER_SITE_OTHER): Payer: Self-pay | Admitting: Primary Care

## 2019-11-01 DIAGNOSIS — N390 Urinary tract infection, site not specified: Secondary | ICD-10-CM

## 2019-11-01 DIAGNOSIS — R319 Hematuria, unspecified: Secondary | ICD-10-CM

## 2019-11-08 ENCOUNTER — Other Ambulatory Visit (INDEPENDENT_AMBULATORY_CARE_PROVIDER_SITE_OTHER): Payer: Self-pay | Admitting: Primary Care

## 2019-11-08 LAB — URINE CULTURE

## 2019-11-08 MED ORDER — NITROFURANTOIN MONOHYD MACRO 100 MG PO CAPS: 100.0000 mg | ORAL_CAPSULE | Freq: Two times a day (BID) | ORAL | 0 refills | Status: DC

## 2019-11-08 MED FILL — NITROFURANTOIN MONO-MCR 100: 100 | 7 days supply | Qty: 14 | Fill #0

## 2019-11-09 ENCOUNTER — Other Ambulatory Visit: Payer: Self-pay

## 2019-11-09 ENCOUNTER — Ambulatory Visit (HOSPITAL_COMMUNITY): Payer: No Typology Code available for payment source | Admitting: Psychiatry

## 2019-11-09 MED FILL — PHENTERMINE 37.5 MG TABLET: 37.5 | 30 days supply | Qty: 30 | Fill #0

## 2019-11-18 ENCOUNTER — Other Ambulatory Visit (HOSPITAL_COMMUNITY): Payer: Self-pay | Admitting: Psychiatry

## 2019-11-18 ENCOUNTER — Telehealth (INDEPENDENT_AMBULATORY_CARE_PROVIDER_SITE_OTHER): Payer: No Typology Code available for payment source | Admitting: Psychiatry

## 2019-11-18 ENCOUNTER — Other Ambulatory Visit: Payer: Self-pay

## 2019-11-18 ENCOUNTER — Encounter (HOSPITAL_COMMUNITY): Payer: Self-pay | Admitting: Psychiatry

## 2019-11-18 DIAGNOSIS — F4310 Post-traumatic stress disorder, unspecified: Secondary | ICD-10-CM | POA: Insufficient documentation

## 2019-11-18 DIAGNOSIS — F331 Major depressive disorder, recurrent, moderate: Secondary | ICD-10-CM | POA: Diagnosis not present

## 2019-11-18 MED ORDER — FLUOXETINE HCL 10 MG PO CAPS
ORAL_CAPSULE | ORAL | 0 refills | Status: DC
Start: 2019-11-18 — End: 2019-12-15

## 2019-11-18 MED ORDER — PRAZOSIN HCL 1 MG PO CAPS
1.0000 mg | ORAL_CAPSULE | Freq: Every day | ORAL | 0 refills | Status: DC
Start: 2019-11-18 — End: 2019-12-15

## 2019-11-18 MED FILL — FLUoxetine HCL 10 MG CAPS: 10 | 30 days supply | Qty: 53 | Fill #0
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
## 2019-11-22 ENCOUNTER — Other Ambulatory Visit (HOSPITAL_COMMUNITY): Payer: Self-pay | Admitting: Obstetrics and Gynecology

## 2019-11-22 MED FILL — XULANE PATCH: 150-35 | 28 days supply | Qty: 3 | Fill #0

## 2019-11-29 ENCOUNTER — Other Ambulatory Visit: Payer: Self-pay | Admitting: Nurse Practitioner

## 2019-11-29 DIAGNOSIS — R21 Rash and other nonspecific skin eruption: Secondary | ICD-10-CM

## 2019-12-01 ENCOUNTER — Other Ambulatory Visit (HOSPITAL_COMMUNITY): Payer: Self-pay | Admitting: Physician Assistant

## 2019-12-01 MED FILL — valACYclovir HCL 1 GM TABS: 1 | 8 days supply | Qty: 11 | Fill #0

## 2019-12-01 MED FILL — TRIAMCINOLONE 0.1% OINTMENT: 0.1 | 14 days supply | Qty: 80 | Fill #0

## 2019-12-02 MED FILL — XULANE PATCH: 150-35 | 28 days supply | Qty: 3 | Fill #0

## 2019-12-02 MED FILL — FLUoxetine HCL 10 MG CAPS: 10 | 30 days supply | Qty: 53 | Fill #0

## 2019-12-15 ENCOUNTER — Other Ambulatory Visit (INDEPENDENT_AMBULATORY_CARE_PROVIDER_SITE_OTHER): Payer: Self-pay | Admitting: Primary Care

## 2019-12-15 ENCOUNTER — Other Ambulatory Visit: Payer: Self-pay

## 2019-12-15 ENCOUNTER — Telehealth (INDEPENDENT_AMBULATORY_CARE_PROVIDER_SITE_OTHER): Payer: No Typology Code available for payment source | Admitting: Psychiatry

## 2019-12-15 DIAGNOSIS — F330 Major depressive disorder, recurrent, mild: Secondary | ICD-10-CM | POA: Diagnosis not present

## 2019-12-15 DIAGNOSIS — F4310 Post-traumatic stress disorder, unspecified: Secondary | ICD-10-CM

## 2019-12-15 MED ORDER — FLUCONAZOLE 150 MG PO TABS: 150.0000 mg | ORAL_TABLET | Freq: Every day | ORAL | 1 refills | Status: DC

## 2019-12-15 MED ORDER — FLUOXETINE HCL 20 MG PO CAPS
20.0000 mg | ORAL_CAPSULE | Freq: Every day | ORAL | 0 refills | Status: DC
Start: 2019-12-15 — End: 2020-03-17

## 2019-12-15 MED FILL — FLUoxetine HCL 20 MG CAPS: 20 | 90 days supply | Qty: 90 | Fill #0

## 2019-12-15 MED FILL — FLUCONAZOLE 150 MG TABS: 150 | 2 days supply | Qty: 2 | Fill #0
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
## 2019-12-23 ENCOUNTER — Encounter (HOSPITAL_COMMUNITY): Payer: Self-pay

## 2019-12-24 IMAGING — RF DG CHOLANGIOGRAM OPERATIVE
1 series · 4 of 4 positions shown · non-contrast
Comparison: CT abdomen and pelvis - 05/03/2016

CLINICAL DATA: Intraoperative cholangiogram during laparoscopic
cholecystectomy.

EXAM:
INTRAOPERATIVE CHOLANGIOGRAM
FLUOROSCOPY TIME:  14 seconds

[Series 1: run · 4 of 80 frames shown]
[frame 13/80]
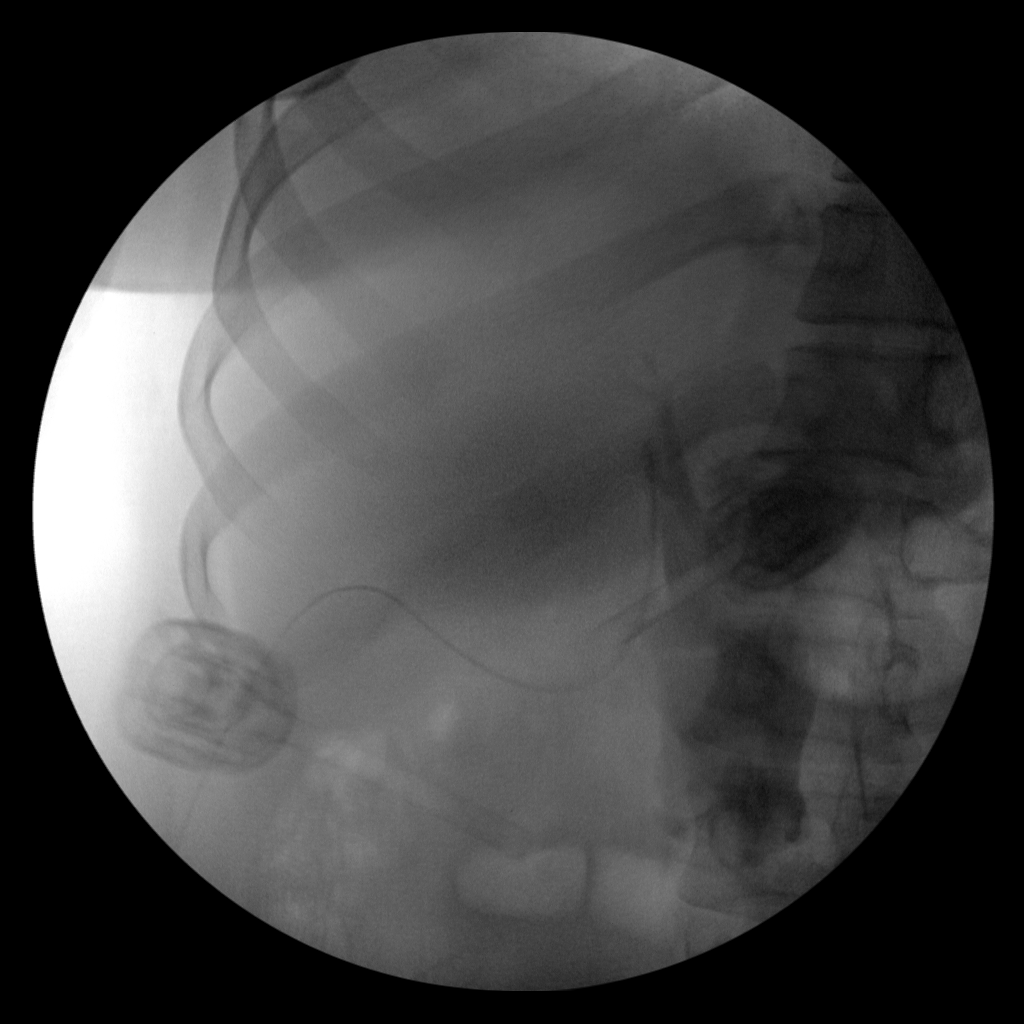
[frame 41/80]
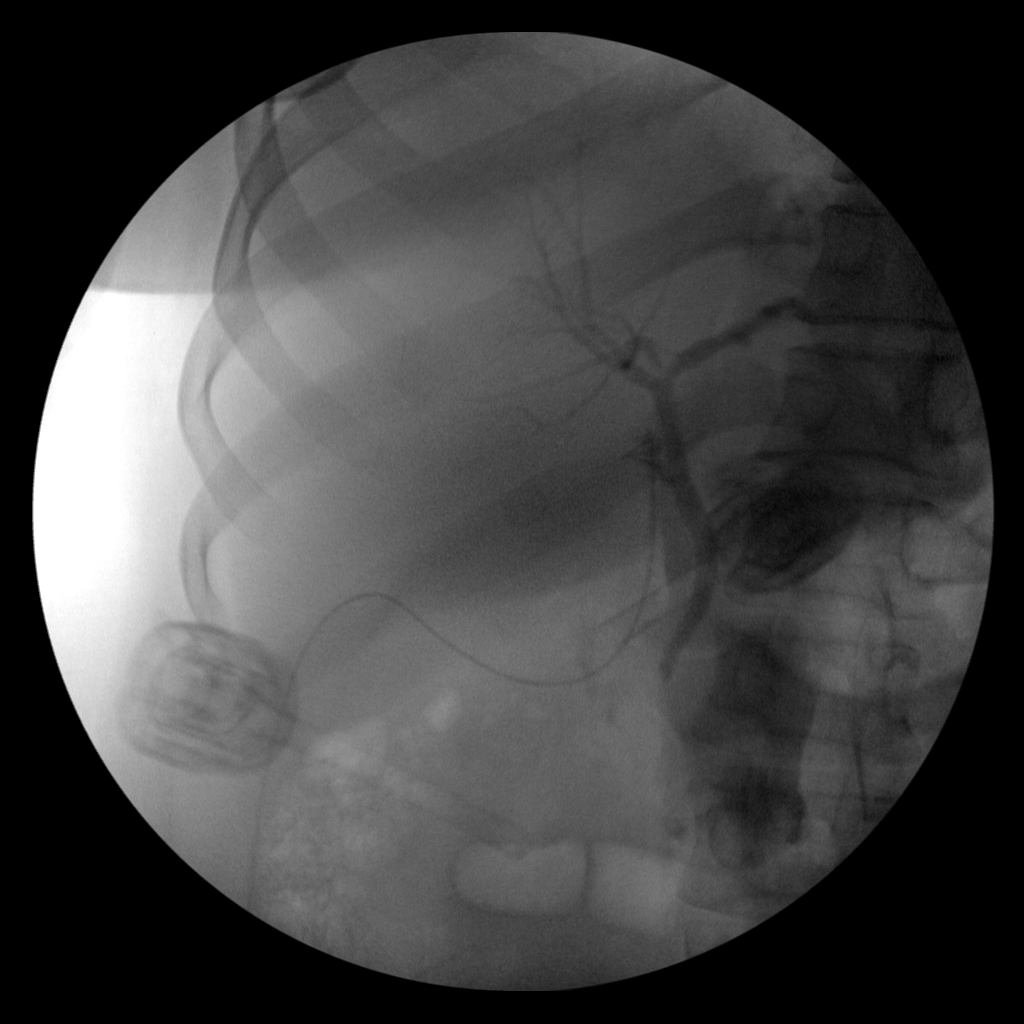
[frame 69/80]
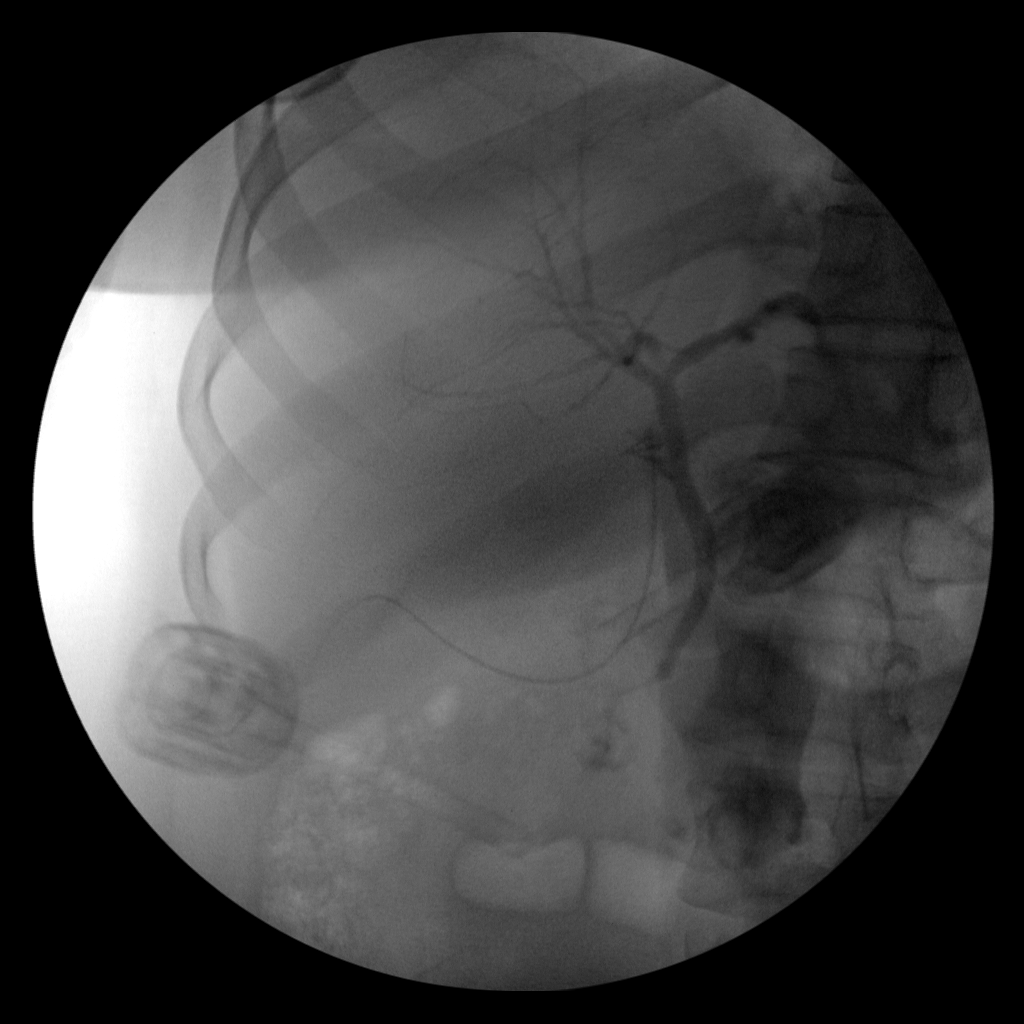
[frame 80/80]
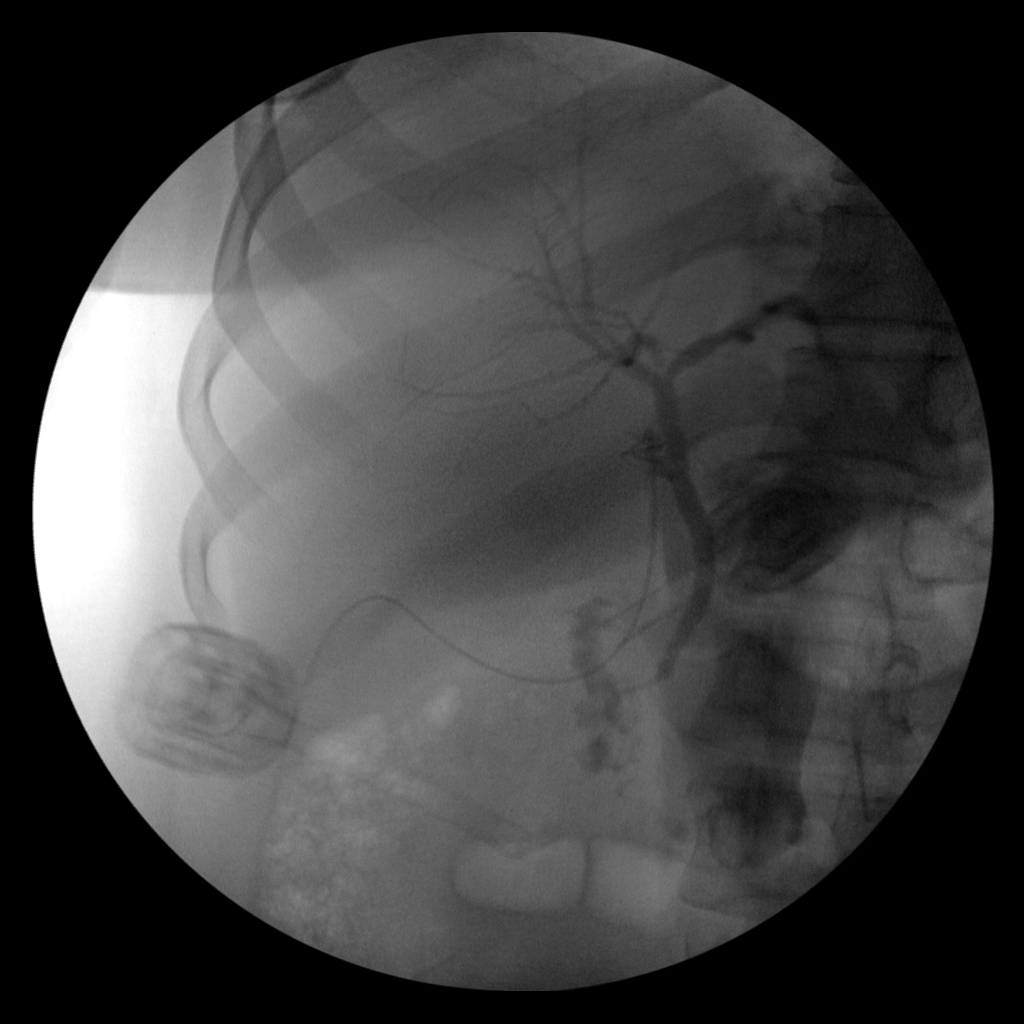

[4 of 4 positions shown; findings below may reference images not displayed]

FINDINGS: Intraoperative cholangiographic images of the right upper abdominal
quadrant during laparoscopic cholecystectomy are provided for
review.

Surgical clips overlie the expected location of the gallbladder
fossa.

Contrast injection demonstrates selective cannulation of the central
aspect of the cystic duct.

There is passage of contrast through the central aspect of the
cystic duct with filling of a non dilated common bile duct. There is
passage of contrast though the CBD and into the descending portion
of the duodenum.

There is minimal reflux of injected contrast into the common hepatic
duct and central aspect of the non dilated intrahepatic biliary
system.

There are no discrete filling defects within the opacified portions
of the biliary system to suggest the presence of
choledocholithiasis.
IMPRESSION: No evidence of choledocholithiasis.

## 2019-12-27 ENCOUNTER — Other Ambulatory Visit (INDEPENDENT_AMBULATORY_CARE_PROVIDER_SITE_OTHER): Payer: Self-pay | Admitting: Primary Care

## 2019-12-27 MED ORDER — FLUCONAZOLE 150 MG PO TABS: 150.0000 mg | ORAL_TABLET | Freq: Once | ORAL | 0 refills | Status: DC

## 2019-12-27 MED ORDER — CIPROFLOXACIN HCL 500 MG PO TABS: 500.0000 mg | ORAL_TABLET | Freq: Two times a day (BID) | ORAL | 0 refills | Status: DC

## 2019-12-27 MED FILL — FLUCONAZOLE 150 MG TABS: 150 | 1 days supply | Qty: 1 | Fill #0

## 2019-12-27 MED FILL — CIPROFLOXACIN HCL 500 MG TA: 500 | 3 days supply | Qty: 6 | Fill #0

## 2019-12-28 MED FILL — valACYclovir HCL 1 GM TABS: 1 | 8 days supply | Qty: 11 | Fill #1

## 2020-01-04 ENCOUNTER — Other Ambulatory Visit (HOSPITAL_COMMUNITY)
Admission: RE | Admit: 2020-01-04 | Discharge: 2020-01-04 | Disposition: A | Discharge status: Home / Self Care | Payer: No Typology Code available for payment source | Source: Ambulatory Visit | Attending: Primary Care | Admitting: Primary Care

## 2020-01-04 ENCOUNTER — Other Ambulatory Visit (INDEPENDENT_AMBULATORY_CARE_PROVIDER_SITE_OTHER): Payer: Self-pay | Admitting: Primary Care

## 2020-01-04 DIAGNOSIS — N898 Other specified noninflammatory disorders of vagina: Secondary | ICD-10-CM | POA: Insufficient documentation

## 2020-01-05 ENCOUNTER — Other Ambulatory Visit (INDEPENDENT_AMBULATORY_CARE_PROVIDER_SITE_OTHER): Payer: Self-pay | Admitting: Primary Care

## 2020-01-05 DIAGNOSIS — N760 Acute vaginitis: Secondary | ICD-10-CM

## 2020-01-05 DIAGNOSIS — B9689 Other specified bacterial agents as the cause of diseases classified elsewhere: Secondary | ICD-10-CM

## 2020-01-05 LAB — CERVICOVAGINAL ANCILLARY ONLY
Bacterial Vaginitis (gardnerella): POSITIVE — AB
Candida Glabrata: NEGATIVE
Candida Vaginitis: POSITIVE — AB
Chlamydia: NEGATIVE
Comment: NEGATIVE
Comment: NEGATIVE
Comment: NEGATIVE
Comment: NEGATIVE
Comment: NEGATIVE
Comment: NORMAL
Neisseria Gonorrhea: NEGATIVE
Trichomonas: POSITIVE — AB

## 2020-01-05 MED ORDER — FLUCONAZOLE 150 MG PO TABS: ORAL_TABLET | ORAL | 1 refills | Status: DC

## 2020-01-05 MED FILL — FLUCONAZOLE 150 MG TABS: 150 | 1 days supply | Qty: 1 | Fill #0

## 2020-01-17 ENCOUNTER — Other Ambulatory Visit (INDEPENDENT_AMBULATORY_CARE_PROVIDER_SITE_OTHER): Payer: Self-pay | Admitting: Primary Care

## 2020-01-17 ENCOUNTER — Other Ambulatory Visit (HOSPITAL_COMMUNITY)
Admission: RE | Admit: 2020-01-17 | Discharge: 2020-01-17 | Disposition: A | Discharge status: Home / Self Care | Payer: No Typology Code available for payment source | Source: Ambulatory Visit | Attending: Primary Care | Admitting: Primary Care

## 2020-01-17 DIAGNOSIS — Z113 Encounter for screening for infections with a predominantly sexual mode of transmission: Secondary | ICD-10-CM | POA: Insufficient documentation

## 2020-01-19 LAB — CERVICOVAGINAL ANCILLARY ONLY
Bacterial Vaginitis (gardnerella): NEGATIVE
Candida Glabrata: NEGATIVE
Candida Vaginitis: NEGATIVE
Chlamydia: NEGATIVE
Comment: NEGATIVE
Comment: NEGATIVE
Comment: NEGATIVE
Comment: NEGATIVE
Comment: NEGATIVE
Comment: NORMAL
Neisseria Gonorrhea: NEGATIVE
Trichomonas: NEGATIVE

## 2020-01-21 ENCOUNTER — Other Ambulatory Visit: Payer: Self-pay

## 2020-01-21 ENCOUNTER — Telehealth (HOSPITAL_COMMUNITY): Payer: No Typology Code available for payment source | Admitting: Psychiatry

## 2020-01-26 ENCOUNTER — Telehealth (HOSPITAL_COMMUNITY): Payer: Self-pay | Admitting: *Deleted

## 2020-01-26 NOTE — Telephone Encounter (Signed)
Received accomodation forms from [employer]. I do not see anything in your last note indicating that she is asking for an accomodation at work. I do see however in previous note that you recommended intermittent FMLA. Please review and advise.

## 2020-01-27 NOTE — Telephone Encounter (Signed)
Right. Will do. Thanks.

## 2020-01-27 NOTE — Telephone Encounter (Signed)
Sometimes it takes so long for these forms to arrive. I guess we discussed it at her previous visit (as you can imagine I do not remember details) so we should follow what is in that note. Pam typically gets in contact with patients prior to finalizing work on these forms to see if anything has changed in their situation.

## 2020-02-02 MED FILL — XULANE PATCH: 150-35 | 84 days supply | Qty: 9 | Fill #1

## 2020-02-16 ENCOUNTER — Other Ambulatory Visit: Payer: Self-pay

## 2020-02-16 ENCOUNTER — Telehealth (INDEPENDENT_AMBULATORY_CARE_PROVIDER_SITE_OTHER): Payer: No Typology Code available for payment source | Admitting: Psychiatry

## 2020-02-16 DIAGNOSIS — F4310 Post-traumatic stress disorder, unspecified: Secondary | ICD-10-CM | POA: Diagnosis not present

## 2020-02-16 DIAGNOSIS — F330 Major depressive disorder, recurrent, mild: Secondary | ICD-10-CM | POA: Diagnosis not present

## 2020-02-16 DIAGNOSIS — F411 Generalized anxiety disorder: Secondary | ICD-10-CM | POA: Diagnosis not present

## 2020-02-16 MED FILL — PHENTERMINE 37.5 MG TABLET: 37.5 | 30 days supply | Qty: 30 | Fill #1

## 2020-02-16 MED FILL — valACYclovir HCL 1 GM TABS: 1 | 8 days supply | Qty: 11 | Fill #2
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
## 2020-02-19 DIAGNOSIS — D649 Anemia, unspecified: Secondary | ICD-10-CM

## 2020-02-19 HISTORY — DX: Anemia, unspecified: D64.9

## 2020-02-25 ENCOUNTER — Other Ambulatory Visit: Payer: Self-pay | Admitting: Nurse Practitioner

## 2020-02-25 MED FILL — valACYclovir HCL 1 GM TABS: 1 | 8 days supply | Qty: 11 | Fill #2

## 2020-02-25 NOTE — Telephone Encounter (Signed)
Requested medication (s) are due for refill today - unsure  Requested medication (s) are on the active medication list -yes  Future visit scheduled -no  Last refill: 11/09/19  Notes to clinic: Request for medication: non delegated, historical provider  Requested Prescriptions  Pending Prescriptions Disp Refills   phentermine (ADIPEX-P) 37.5 MG tablet [Pharmacy Med Name: PHENTERMINE 37.5 MG TABLET 37.5 Tablet] 30 tablet 2    Sig: TAKE 1 TABLET (37.5 MG TOTAL) BY MOUTH DAILY BEFORE BREAKFAST.      Not Delegated - Gastroenterology:  Antiobesity Agents Failed - 02/25/2020 11:45 AM      Failed - This refill cannot be delegated      Failed - Last Heart Rate in normal range    Pulse Readings from Last 1 Encounters:  03/12/19 95          Passed - Last BP in normal range    BP Readings from Last 1 Encounters:  03/12/19 125/79          Passed - Valid encounter within last 12 months    Recent Outpatient Visits           11 months ago Urinary frequency   [place], [place], NP   1 year ago Insect bite of right thigh, initial encounter   [place], [place], NP   1 year ago Vaginal discharge   [place], [place], NP   2 years ago Encounter for annual physical exam   [place] Gildardo Pounds, NP   2 years ago Obesity (BMI 30-39.9)   [place], [place] W, NP                    Requested Prescriptions  Pending Prescriptions Disp Refills   phentermine (ADIPEX-P) 37.5 MG tablet [Pharmacy Med Name: PHENTERMINE 37.5 MG TABLET 37.5 Tablet] 30 tablet 2    Sig: TAKE 1 TABLET (37.5 MG TOTAL) BY MOUTH DAILY BEFORE BREAKFAST.      Not Delegated - Gastroenterology:  Antiobesity Agents Failed - 02/25/2020 11:45 AM      Failed - This refill cannot be delegated      Failed - Last Heart Rate in normal range    Pulse  Readings from Last 1 Encounters:  03/12/19 95          Passed - Last BP in normal range    BP Readings from Last 1 Encounters:  03/12/19 125/79          Passed - Valid encounter within last 12 months    Recent Outpatient Visits           11 months ago Urinary frequency   [place], [place], NP   1 year ago Insect bite of right thigh, initial encounter   [place], [place], NP   1 year ago Vaginal discharge   [place], [place], NP   2 years ago Encounter for annual physical exam   [place] Gildardo Pounds, NP   2 years ago Obesity (BMI 30-39.9)   [place], Vernia Buff, NP

## 2020-03-07 ENCOUNTER — Other Ambulatory Visit: Payer: Self-pay | Admitting: Nurse Practitioner

## 2020-03-07 NOTE — Telephone Encounter (Signed)
Requested medication (s) are due for refill today:unsure  Requested medication (s) are on the active medication list: yes Historic provider  Future visit scheduled: no  Notes to clinic: please review    Requested Prescriptions  Pending Prescriptions Disp Refills   phentermine (ADIPEX-P) 37.5 MG tablet [Pharmacy Med Name: PHENTERMINE 37.5 MG TABLET 37.5 Tablet] 30 tablet 2    Sig: TAKE 1 TABLET (37.5 MG TOTAL) BY MOUTH DAILY BEFORE BREAKFAST.      Not Delegated - Gastroenterology:  Antiobesity Agents Failed - 03/07/2020 10:12 AM      Failed - This refill cannot be delegated      Failed - Last Heart Rate in normal range    Pulse Readings from Last 1 Encounters:  03/12/19 95          Failed - Valid encounter within last 12 months    Recent Outpatient Visits           12 months ago Urinary frequency   [place], [place], NP   1 year ago Insect bite of right thigh, initial encounter   [place], [place], NP   1 year ago Vaginal discharge   [place], [place], NP   2 years ago Encounter for annual physical exam   [place] Gildardo Pounds, NP   3 years ago Obesity (BMI 30-39.9)   [place], NP                Passed - Last BP in normal range    BP Readings from Last 1 Encounters:  03/12/19 125/79

## 2020-03-17 ENCOUNTER — Other Ambulatory Visit: Payer: Self-pay

## 2020-03-17 ENCOUNTER — Telehealth (INDEPENDENT_AMBULATORY_CARE_PROVIDER_SITE_OTHER): Payer: No Typology Code available for payment source | Admitting: Psychiatry

## 2020-03-17 ENCOUNTER — Other Ambulatory Visit (HOSPITAL_COMMUNITY): Payer: Self-pay | Admitting: Psychiatry

## 2020-03-17 DIAGNOSIS — F4310 Post-traumatic stress disorder, unspecified: Secondary | ICD-10-CM | POA: Diagnosis not present

## 2020-03-17 DIAGNOSIS — F411 Generalized anxiety disorder: Secondary | ICD-10-CM

## 2020-03-17 DIAGNOSIS — F330 Major depressive disorder, recurrent, mild: Secondary | ICD-10-CM | POA: Diagnosis not present

## 2020-03-17 MED ORDER — FLUOXETINE HCL 20 MG PO CAPS: 20.0000 mg | ORAL_CAPSULE | Freq: Every day | ORAL | 0 refills | Status: DC

## 2020-03-17 MED FILL — FLUoxetine HCL 20 MG CAPS: 20 | 90 days supply | Qty: 90 | Fill #0
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
## 2020-03-31 ENCOUNTER — Other Ambulatory Visit (INDEPENDENT_AMBULATORY_CARE_PROVIDER_SITE_OTHER): Payer: Self-pay | Admitting: Primary Care

## 2020-03-31 MED ORDER — PHENTERMINE HCL 37.5 MG PO TABS
37.5000 mg | ORAL_TABLET | Freq: Every morning | ORAL | 0 refills | Status: DC
Start: 2020-03-31 — End: 2020-03-31

## 2020-03-31 MED FILL — PHENTERMINE 37.5 MG TABLET: 37.5 | 30 days supply | Qty: 30 | Fill #0

## 2020-04-28 ENCOUNTER — Other Ambulatory Visit (HOSPITAL_COMMUNITY): Payer: Self-pay | Admitting: Psychiatry

## 2020-04-28 ENCOUNTER — Other Ambulatory Visit: Payer: Self-pay

## 2020-04-28 ENCOUNTER — Telehealth (INDEPENDENT_AMBULATORY_CARE_PROVIDER_SITE_OTHER): Payer: No Typology Code available for payment source | Admitting: Psychiatry

## 2020-04-28 DIAGNOSIS — F411 Generalized anxiety disorder: Secondary | ICD-10-CM

## 2020-04-28 DIAGNOSIS — F4310 Post-traumatic stress disorder, unspecified: Secondary | ICD-10-CM | POA: Diagnosis not present

## 2020-04-28 DIAGNOSIS — F3342 Major depressive disorder, recurrent, in full remission: Secondary | ICD-10-CM

## 2020-04-28 MED ORDER — FLUOXETINE HCL 20 MG PO CAPS: 20.0000 mg | ORAL_CAPSULE | Freq: Every day | ORAL | 0 refills | Status: DC

## 2020-04-28 MED FILL — FLUoxetine HCL 20 MG CAPS: 20 | 90 days supply | Qty: 90 | Fill #0
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
## 2020-06-13 ENCOUNTER — Other Ambulatory Visit (INDEPENDENT_AMBULATORY_CARE_PROVIDER_SITE_OTHER): Payer: Self-pay | Admitting: Primary Care

## 2020-06-21 ENCOUNTER — Ambulatory Visit (INDEPENDENT_AMBULATORY_CARE_PROVIDER_SITE_OTHER): Payer: 59 | Admitting: *Deleted

## 2020-06-21 ENCOUNTER — Other Ambulatory Visit: Payer: Self-pay

## 2020-06-21 VITALS — BP 122/83 | HR 95 | Temp 98.0°F | Ht 64.0 in | Wt 170.6 lb

## 2020-06-21 DIAGNOSIS — Z3201 Encounter for pregnancy test, result positive: Secondary | ICD-10-CM | POA: Diagnosis not present

## 2020-06-21 DIAGNOSIS — Z789 Other specified health status: Secondary | ICD-10-CM

## 2020-06-21 LAB — POCT URINE PREGNANCY: Preg Test, Ur: POSITIVE — AB

## 2020-06-21 NOTE — Progress Notes (Signed)
   Ms. Karen Jordan presents today for UPT. She has no unusual complaints. LMP:    OBJECTIVE: Appears well, in no apparent distress.  OB History    Gravida  3   Para  1   Term  1   Preterm  0   AB  2   Living  1     SAB  0   IAB  2   Ectopic  0   Multiple  0   Live Births  1          Home UPT Result: Positive In-Office UPT result: Positive I have reviewed the patient's medical, obstetrical, social, and family histories, and medications.   ASSESSMENT: Positive pregnancy test  PLAN Prenatal care to be completed at: Unsure Early ultrasound to confirm dating  Derl Barrow, RN

## 2020-06-26 ENCOUNTER — Other Ambulatory Visit: Payer: Self-pay

## 2020-06-26 ENCOUNTER — Telehealth (INDEPENDENT_AMBULATORY_CARE_PROVIDER_SITE_OTHER): Payer: No Typology Code available for payment source | Admitting: Psychiatry

## 2020-06-26 ENCOUNTER — Encounter (HOSPITAL_COMMUNITY): Payer: Self-pay | Admitting: Psychiatry

## 2020-06-26 DIAGNOSIS — F3342 Major depressive disorder, recurrent, in full remission: Secondary | ICD-10-CM

## 2020-06-26 DIAGNOSIS — F411 Generalized anxiety disorder: Secondary | ICD-10-CM
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
## 2020-07-11 ENCOUNTER — Other Ambulatory Visit: Payer: Self-pay

## 2020-07-11 ENCOUNTER — Ambulatory Visit
Admission: RE | Admit: 2020-07-11 | Discharge: 2020-07-11 | Disposition: A | Discharge status: Home / Self Care | Payer: No Typology Code available for payment source | Source: Ambulatory Visit | Attending: Obstetrics and Gynecology | Admitting: Obstetrics and Gynecology

## 2020-07-11 DIAGNOSIS — Z789 Other specified health status: Secondary | ICD-10-CM | POA: Diagnosis present

## 2020-07-11 DIAGNOSIS — O3680X0 Pregnancy with inconclusive fetal viability, not applicable or unspecified: Secondary | ICD-10-CM | POA: Diagnosis not present

## 2020-07-11 DIAGNOSIS — Z3A01 Less than 8 weeks gestation of pregnancy: Secondary | ICD-10-CM | POA: Diagnosis not present

## 2020-07-11 DIAGNOSIS — Z3201 Encounter for pregnancy test, result positive: Secondary | ICD-10-CM | POA: Diagnosis not present

## 2020-07-18 ENCOUNTER — Telehealth (HOSPITAL_COMMUNITY): Payer: Self-pay | Admitting: *Deleted

## 2020-07-18 NOTE — Telephone Encounter (Signed)
Placed call to patient to inform of cancelled appointment and inability to continue to provide services. Spoke with patient  regarding this and information that a letter with resources would be provided. Patient verbalized understanding.  Did not need refills.   

## 2020-07-27 ENCOUNTER — Telehealth (HOSPITAL_COMMUNITY): Payer: No Typology Code available for payment source | Admitting: Psychiatry

## 2020-08-01 ENCOUNTER — Ambulatory Visit: Payer: 59

## 2020-08-01 ENCOUNTER — Other Ambulatory Visit: Payer: Self-pay

## 2020-08-01 ENCOUNTER — Inpatient Hospital Stay (HOSPITAL_COMMUNITY)
Admission: AD | Admit: 2020-08-01 | Discharge: 2020-08-01 | Disposition: A | Discharge status: Home / Self Care | Payer: No Typology Code available for payment source | Attending: Obstetrics and Gynecology | Admitting: Obstetrics and Gynecology

## 2020-08-01 ENCOUNTER — Encounter (HOSPITAL_COMMUNITY): Payer: Self-pay | Admitting: Obstetrics and Gynecology

## 2020-08-01 ENCOUNTER — Other Ambulatory Visit (HOSPITAL_COMMUNITY): Payer: Self-pay

## 2020-08-01 ENCOUNTER — Inpatient Hospital Stay (HOSPITAL_COMMUNITY): Payer: No Typology Code available for payment source

## 2020-08-01 DIAGNOSIS — Z6790 Unspecified blood type, Rh positive: Secondary | ICD-10-CM | POA: Diagnosis not present

## 2020-08-01 DIAGNOSIS — O4690 Antepartum hemorrhage, unspecified, unspecified trimester: Secondary | ICD-10-CM

## 2020-08-01 DIAGNOSIS — R109 Unspecified abdominal pain: Secondary | ICD-10-CM | POA: Diagnosis not present

## 2020-08-01 DIAGNOSIS — O034 Incomplete spontaneous abortion without complication: Secondary | ICD-10-CM | POA: Diagnosis not present

## 2020-08-01 DIAGNOSIS — O99331 Smoking (tobacco) complicating pregnancy, first trimester: Secondary | ICD-10-CM | POA: Insufficient documentation

## 2020-08-01 DIAGNOSIS — Z3A09 9 weeks gestation of pregnancy: Secondary | ICD-10-CM | POA: Diagnosis not present

## 2020-08-01 DIAGNOSIS — F17210 Nicotine dependence, cigarettes, uncomplicated: Secondary | ICD-10-CM | POA: Insufficient documentation

## 2020-08-01 DIAGNOSIS — O26891 Other specified pregnancy related conditions, first trimester: Secondary | ICD-10-CM | POA: Diagnosis present

## 2020-08-01 LAB — CBC
HCT: 34.8 % — ABNORMAL LOW (ref 36.0–46.0)
Hemoglobin: 11.7 g/dL — ABNORMAL LOW (ref 12.0–15.0)
MCH: 31.1 pg (ref 26.0–34.0)
MCHC: 33.6 g/dL (ref 30.0–36.0)
MCV: 92.6 fL (ref 80.0–100.0)
Platelets: 274 10*3/uL (ref 150–400)
RBC: 3.76 MIL/uL — ABNORMAL LOW (ref 3.87–5.11)
RDW: 11.8 % (ref 11.5–15.5)
WBC: 5.6 10*3/uL (ref 4.0–10.5)
nRBC: 0 % (ref 0.0–0.2)

## 2020-08-01 LAB — URINALYSIS, ROUTINE W REFLEX MICROSCOPIC
Bacteria, UA: NONE SEEN
Bilirubin Urine: NEGATIVE
Glucose, UA: NEGATIVE mg/dL
Ketones, ur: NEGATIVE mg/dL
Leukocytes,Ua: NEGATIVE
Nitrite: NEGATIVE
Protein, ur: NEGATIVE mg/dL
Specific Gravity, Urine: 1.018 (ref 1.005–1.030)
pH: 8 (ref 5.0–8.0)

## 2020-08-01 LAB — HCG, QUANTITATIVE, PREGNANCY: hCG, Beta Chain, Quant, S: 29453 m[IU]/mL — ABNORMAL HIGH (ref <5)

## 2020-08-01 LAB — WET PREP, GENITAL
Clue Cells Wet Prep HPF POC: NONE SEEN
Sperm: NONE SEEN
Trich, Wet Prep: NONE SEEN
WBC, Wet Prep HPF POC: NONE SEEN
Yeast Wet Prep HPF POC: NONE SEEN

## 2020-08-01 MED ORDER — ACETAMINOPHEN 325 MG PO TABS
650.0000 mg | ORAL_TABLET | Freq: Four times a day (QID) | ORAL | 0 refills | Status: DC | PRN
  Filled 2020-08-01: qty 30, 4d supply, fill #0

## 2020-08-01 MED ORDER — OXYCODONE HCL 5 MG PO TABS
5.0000 mg | ORAL_TABLET | ORAL | 0 refills | Status: DC | PRN
  Filled 2020-08-01: qty 8, 2d supply, fill #0

## 2020-08-01 MED ORDER — PROMETHAZINE HCL 25 MG PO TABS
25.0000 mg | ORAL_TABLET | Freq: Four times a day (QID) | ORAL | 0 refills | Status: DC | PRN
  Filled 2020-08-01: qty 8, 2d supply, fill #0

## 2020-08-01 MED ORDER — IBUPROFEN 600 MG PO TABS
600.0000 mg | ORAL_TABLET | Freq: Four times a day (QID) | ORAL | 0 refills | Status: DC | PRN
  Filled 2020-08-01: qty 30, 8d supply, fill #0

## 2020-08-01 MED ORDER — MISOPROSTOL 200 MCG PO TABS
800.0000 ug | ORAL_TABLET | Freq: Once | ORAL | 0 refills | Status: DC
  Filled 2020-08-01: qty 4, 1d supply, fill #0

## 2020-08-01 NOTE — MAU Note (Signed)
Presents with c/o cramping and VB x2 days.  Reports VB with wiping and in panties, doesn't require a sanitary napkin or panty liner.  Denies passing blood clots.
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
## 2020-08-02 LAB — GC/CHLAMYDIA PROBE AMP (~~LOC~~) NOT AT ARMC
Chlamydia: NEGATIVE
Comment: NEGATIVE
Comment: NORMAL
Neisseria Gonorrhea: NEGATIVE

## 2020-08-07 ENCOUNTER — Other Ambulatory Visit: Payer: Self-pay | Admitting: Nurse Practitioner

## 2020-08-07 DIAGNOSIS — F32A Depression, unspecified: Secondary | ICD-10-CM

## 2020-08-07 DIAGNOSIS — F419 Anxiety disorder, unspecified: Secondary | ICD-10-CM

## 2020-08-09 ENCOUNTER — Encounter (HOSPITAL_COMMUNITY): Payer: Self-pay | Admitting: Family Medicine

## 2020-08-09 ENCOUNTER — Other Ambulatory Visit: Payer: Self-pay

## 2020-08-09 ENCOUNTER — Inpatient Hospital Stay (HOSPITAL_COMMUNITY)
Admission: AD | Admit: 2020-08-09 | Discharge: 2020-08-11 | DRG: 770 | Disposition: A | Discharge status: Home / Self Care | Payer: No Typology Code available for payment source | Attending: Obstetrics & Gynecology | Admitting: Obstetrics & Gynecology

## 2020-08-09 DIAGNOSIS — Z20822 Contact with and (suspected) exposure to covid-19: Secondary | ICD-10-CM | POA: Diagnosis present

## 2020-08-09 DIAGNOSIS — F17210 Nicotine dependence, cigarettes, uncomplicated: Secondary | ICD-10-CM | POA: Diagnosis present

## 2020-08-09 DIAGNOSIS — D62 Acute posthemorrhagic anemia: Secondary | ICD-10-CM | POA: Diagnosis present

## 2020-08-09 DIAGNOSIS — Z880 Allergy status to penicillin: Secondary | ICD-10-CM | POA: Diagnosis not present

## 2020-08-09 DIAGNOSIS — O031 Delayed or excessive hemorrhage following incomplete spontaneous abortion: Secondary | ICD-10-CM | POA: Diagnosis not present

## 2020-08-09 DIAGNOSIS — D500 Iron deficiency anemia secondary to blood loss (chronic): Secondary | ICD-10-CM | POA: Diagnosis present

## 2020-08-09 DIAGNOSIS — O034 Incomplete spontaneous abortion without complication: Secondary | ICD-10-CM | POA: Diagnosis present

## 2020-08-09 DIAGNOSIS — O0330 Unspecified complication following incomplete spontaneous abortion: Secondary | ICD-10-CM | POA: Diagnosis present

## 2020-08-09 DIAGNOSIS — O0339 Incomplete spontaneous abortion with other complications: Secondary | ICD-10-CM

## 2020-08-09 LAB — CBC
HCT: 19.2 % — ABNORMAL LOW (ref 36.0–46.0)
Hemoglobin: 6.3 g/dL — CL (ref 12.0–15.0)
MCH: 32.5 pg (ref 26.0–34.0)
MCHC: 32.8 g/dL (ref 30.0–36.0)
MCV: 99 fL (ref 80.0–100.0)
Platelets: 390 10*3/uL (ref 150–400)
RBC: 1.94 MIL/uL — ABNORMAL LOW (ref 3.87–5.11)
RDW: 14.3 % (ref 11.5–15.5)
WBC: 6.2 10*3/uL (ref 4.0–10.5)
nRBC: 0 % (ref 0.0–0.2)

## 2020-08-09 LAB — BASIC METABOLIC PANEL
Anion gap: 5 (ref 5–15)
BUN: 8 mg/dL (ref 6–20)
CO2: 23 mmol/L (ref 22–32)
Calcium: 8.2 mg/dL — ABNORMAL LOW (ref 8.9–10.3)
Chloride: 109 mmol/L (ref 98–111)
Creatinine, Ser: 0.89 mg/dL (ref 0.44–1.00)
GFR, Estimated: >60 mL/min (ref >60)
Glucose, Bld: 118 mg/dL — ABNORMAL HIGH (ref 70–99)
Potassium: 3.8 mmol/L (ref 3.5–5.1)
Sodium: 137 mmol/L (ref 135–145)

## 2020-08-09 LAB — PREPARE RBC (CROSSMATCH)

## 2020-08-09 MED ORDER — LACTATED RINGERS IV SOLN: Freq: Once | INTRAVENOUS | Status: AC

## 2020-08-09 MED ORDER — ACETAMINOPHEN 325 MG PO TABS
650.0000 mg | ORAL_TABLET | ORAL | Status: DC | PRN
  Administered 2020-08-09 – 2020-08-11 (×3): 650 mg via ORAL
  Filled 2020-08-09 (×3): qty 2

## 2020-08-09 MED ORDER — DIPHENHYDRAMINE HCL 50 MG/ML IJ SOLN
25.0000 mg | Freq: Four times a day (QID) | INTRAMUSCULAR | Status: DC | PRN
Start: 2020-08-09 — End: 2020-08-11
  Administered 2020-08-09: 25 mg via INTRAVENOUS
  Filled 2020-08-09: qty 1

## 2020-08-09 MED ORDER — MISOPROSTOL 50MCG HALF TABLET
100.0000 ug | ORAL_TABLET | Freq: Once | ORAL | Status: AC
  Administered 2020-08-09: 100 ug via RECTAL
  Filled 2020-08-09: qty 2

## 2020-08-09 MED ORDER — SODIUM CHLORIDE 0.9% IV SOLUTION: Freq: Once | INTRAVENOUS | Status: AC

## 2020-08-09 MED ORDER — HYDROMORPHONE HCL 1 MG/ML IJ SOLN
0.5000 mg | Freq: Once | INTRAMUSCULAR | Status: AC
  Administered 2020-08-09: 0.5 mg via INTRAVENOUS
  Filled 2020-08-09: qty 1

## 2020-08-09 MED ORDER — MISOPROSTOL 50MCG HALF TABLET: 100.0000 ug | ORAL_TABLET | Freq: Once | ORAL | Status: DC

## 2020-08-09 MED ORDER — SODIUM CHLORIDE 0.9 % IV SOLN: INTRAVENOUS | Status: DC

## 2020-08-09 MED ORDER — PRENATAL MULTIVITAMIN CH
1.0000 | ORAL_TABLET | Freq: Every day | ORAL | Status: DC
  Administered 2020-08-10 – 2020-08-11 (×2): 1 via ORAL
  Filled 2020-08-09 (×2): qty 1

## 2020-08-09 MED ORDER — ONDANSETRON HCL 4 MG/2ML IJ SOLN
4.0000 mg | Freq: Once | INTRAMUSCULAR | Status: AC
  Administered 2020-08-09: 4 mg via INTRAVENOUS
  Filled 2020-08-09: qty 2

## 2020-08-09 NOTE — MAU Note (Signed)
CRITICAL VALUE STICKER  CRITICAL VALUE: Hgb  RECEIVER (on-site recipient of call): Macy Mis, RN  [place] NOTIFIED: 08/09/2020 @ 2240  MESSENGER (representative from lab): [place] Environmental education officer)  MD NOTIFIED: Hansel Feinstein, CNM  TIME OF NOTIFICATION: 2243  RESPONSE:  RN to obtain orthostatic v/s
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
## 2020-08-09 NOTE — MAU Note (Signed)
..  Karen Jordan is a 35 y.o. at [redacted]w[redacted]d here in MAU reporting: She was seen here last week and was told she had a miscarriage, took Cytotec, and bleed for a few days, stopped bleeding and then began bleeding today. Today she is passing large clots and has abdominal pain. Reports dizziness after she passes a clot.   Pain score: 8/10 Vitals:   08/09/20 2056 08/09/20 2057  BP:  (!) 99/51  Pulse:  (!) 113  Resp:  15  SpO2: 99%       Lab orders placed from triage: UA
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
## 2020-08-10 ENCOUNTER — Inpatient Hospital Stay (HOSPITAL_COMMUNITY): Payer: No Typology Code available for payment source

## 2020-08-10 ENCOUNTER — Other Ambulatory Visit: Payer: No Typology Code available for payment source

## 2020-08-10 DIAGNOSIS — O0330 Unspecified complication following incomplete spontaneous abortion: Secondary | ICD-10-CM | POA: Diagnosis not present

## 2020-08-10 LAB — RESP PANEL BY RT-PCR (FLU A&B, COVID) ARPGX2
Influenza A by PCR: NEGATIVE
Influenza B by PCR: NEGATIVE
SARS Coronavirus 2 by RT PCR: NEGATIVE

## 2020-08-10 LAB — HEMOGLOBIN AND HEMATOCRIT, BLOOD
HCT: 20.5 % — ABNORMAL LOW (ref 36.0–46.0)
Hemoglobin: 7 g/dL — ABNORMAL LOW (ref 12.0–15.0)

## 2020-08-10 LAB — PREPARE RBC (CROSSMATCH)

## 2020-08-10 MED ORDER — IBUPROFEN 600 MG PO TABS
600.0000 mg | ORAL_TABLET | Freq: Four times a day (QID) | ORAL | Status: DC | PRN
  Administered 2020-08-10: 600 mg via ORAL
  Filled 2020-08-10: qty 1

## 2020-08-10 MED ORDER — DIPHENHYDRAMINE HCL 25 MG PO CAPS
25.0000 mg | ORAL_CAPSULE | Freq: Once | ORAL | Status: AC
  Administered 2020-08-10: 25 mg via ORAL
  Filled 2020-08-10: qty 1

## 2020-08-10 MED ORDER — FUROSEMIDE 10 MG/ML IJ SOLN
20.0000 mg | Freq: Once | INTRAMUSCULAR | Status: AC
Start: 2020-08-10 — End: 2020-08-10
  Administered 2020-08-10: 20 mg via INTRAVENOUS
  Filled 2020-08-10: qty 2

## 2020-08-10 MED ORDER — ACETAMINOPHEN 500 MG PO TABS
1000.0000 mg | ORAL_TABLET | Freq: Once | ORAL | Status: AC
  Administered 2020-08-10: 1000 mg via ORAL
  Filled 2020-08-10: qty 2

## 2020-08-10 MED ORDER — ZOLPIDEM TARTRATE 5 MG PO TABS: 10.0000 mg | ORAL_TABLET | Freq: Every evening | ORAL | Status: DC | PRN

## 2020-08-10 MED ORDER — OXYCODONE HCL 5 MG PO TABS
5.0000 mg | ORAL_TABLET | ORAL | Status: DC | PRN
  Administered 2020-08-10 – 2020-08-11 (×2): 5 mg via ORAL
  Filled 2020-08-10 (×2): qty 1

## 2020-08-10 MED ORDER — FLUOXETINE HCL 20 MG PO CAPS
20.0000 mg | ORAL_CAPSULE | Freq: Every day | ORAL | Status: DC
  Administered 2020-08-10 – 2020-08-11 (×2): 20 mg via ORAL
  Filled 2020-08-10 (×2): qty 1

## 2020-08-10 MED ORDER — SODIUM CHLORIDE 0.9% IV SOLUTION: Freq: Once | INTRAVENOUS | Status: AC

## 2020-08-10 MED ORDER — ZOLPIDEM TARTRATE 5 MG PO TABS
5.0000 mg | ORAL_TABLET | Freq: Every evening | ORAL | Status: DC | PRN
  Administered 2020-08-10: 5 mg via ORAL
  Filled 2020-08-10: qty 1

## 2020-08-10 NOTE — Progress Notes (Signed)
Patient has minimal bleeding. Getting second unit of pRBCs. No concerns  Blood pressure 108/67, pulse 77, temperature 98.2 F (36.8 C), temperature source Oral, resp. rate 17, height 5\' 4"  (1.626 m), weight 81.3 kg, SpO2 99 %.  US OB Transvaginal  Result Date: 08/10/2020 CLINICAL DATA:  Incomplete miscarriage. Bleeding and cramping. Quantitative beta HCG was 29,453 on 08/01/2020. Unknown LMP. By first ultrasound patient would be 10 weeks 3 days. EXAM: TRANSVAGINAL OB ULTRASOUND TECHNIQUE: Transvaginal ultrasound was performed for complete evaluation of the gestation as well as the maternal uterus, adnexal regions, and pelvic cul-de-sac. COMPARISON:  08/01/2020 FINDINGS: Intrauterine gestational sac: None Yolk sac:  Not Visualized. Embryo:  Not Visualized. Cardiac Activity: Not Visualized. Uterus: Heterogeneous endometrial contents in the LOWER uterine segment measures 3.8 x 2.4 x 2.9 centimeters. Endometrial contents are vascular on Doppler evaluation. No free pelvic fluid. Maternal uterus/adnexae: RIGHT corpus luteum seen. LEFT ovary is normal in appearance. IMPRESSION: Findings are consistent with incomplete abortion. No normal intrauterine or adnexal pregnancy identified. No free pelvic fluid. Electronically Signed   By: Nolon Nations M.D.   On: 08/10/2020 11:11    Findings discussed with patient. Recommended second dose of Misoprostol vs Dilation and Evacuation (D&E),  she desires D&E.  This was scheduled for tomorrow in Main OR around 1200 (earliest available time).  Will check labs in the morning.  NPO after midnight.   Verita Schneiders, MD, [place] for [employer], [place]

## 2020-08-10 NOTE — Progress Notes (Signed)
Patient ID: Karen Jordan, female   DOB: Sep 22, 1985, 35 y.o.   MRN: [id number]   Assessment/Plan: Active Problems:   Incomplete abortion with complication   Acute blood loss anemia s/p 2 u PRBCs  Repeat H and H Consider DC later today if stable.   Subjective: Interval History:still with some bleeding  Objective: Vital signs in last 24 hours: Temp:  [98.1 F (36.7 C)-98.5 F (36.9 C)] 98.3 F (36.8 C) (06/23 0536) Pulse Rate:  [80-129] 86 (06/23 0536) Resp:  [15-18] 18 (06/23 0536) BP: (83-119)/(43-75) 103/62 (06/23 0536) SpO2:  [99 %-100 %] 100 % (06/23 0536) Weight:  [81.3 kg] 81.3 kg (06/22 2057)  Intake/Output from previous day: 06/22 0701 - 06/23 0700 In: 556.5 [I.V.:20.5] Out: -  Intake/Output this shift: No intake/output data recorded.  General appearance: alert, cooperative, and appears stated age Head: Normocephalic, without obvious abnormality, atraumatic Lungs: normal effort Heart: regular rate and rhythm Abdomen: soft, non-tender; bowel sounds normal; no masses,  no organomegaly Extremities: extremities normal, atraumatic, no cyanosis or edema  Results for orders placed or performed during the hospital encounter of 08/09/20 (from the past 24 hour(s))  CBC     Status: Abnormal   Collection Time: 08/09/20  9:02 PM  Result Value Ref Range   WBC 6.2 4.0 - 10.5 K/uL   RBC 1.94 (L) 3.87 - 5.11 MIL/uL   Hemoglobin 6.3 (LL) 12.0 - 15.0 g/dL   HCT 19.2 (L) 36.0 - 46.0 %   MCV 99.0 80.0 - 100.0 fL   MCH 32.5 26.0 - 34.0 pg   MCHC 32.8 30.0 - 36.0 g/dL   RDW 14.3 11.5 - 15.5 %   Platelets 390 150 - 400 K/uL   nRBC 0.0 0.0 - 0.2 %  Resp Panel by RT-PCR (Flu A&B, Covid) Nasopharyngeal Swab     Status: None   Collection Time: 08/09/20 11:06 PM   Specimen: Nasopharyngeal Swab; Nasopharyngeal(NP) swabs in vial transport medium  Result Value Ref Range   SARS Coronavirus 2 by RT PCR NEGATIVE NEGATIVE   Influenza A by PCR NEGATIVE NEGATIVE   Influenza B by PCR  NEGATIVE NEGATIVE  Type and screen [place]     Status: None (Preliminary result)   Collection Time: 08/09/20 11:12 PM  Result Value Ref Range   ABO/RH(D) A POS    Antibody Screen NEG    Sample Expiration 08/12/2020,2359    Unit Number [id number]    Blood Component Type RBC LR PHER2    Unit division 00    Status of Unit ISSUED    Transfusion Status OK TO TRANSFUSE    Crossmatch Result Compatible    Unit Number [id number]    Blood Component Type RBC LR PHER1    Unit division 00    Status of Unit ISSUED    Transfusion Status OK TO TRANSFUSE    Crossmatch Result      Compatible Performed at [facility], [address] [place], [place] [address]   Prepare RBC (crossmatch)     Status: None   Collection Time: 08/09/20 11:12 PM  Result Value Ref Range   Order Confirmation      ORDER PROCESSED BY BLOOD BANK Performed at [facility], [place] [address], [place],  [address]   Basic metabolic panel     Status: Abnormal   Collection Time: 08/09/20 11:12 PM  Result Value Ref Range   Sodium 137 135 - 145 mmol/L   Potassium 3.8 3.5 -  5.1 mmol/L   Chloride 109 98 - 111 mmol/L   CO2 23 22 - 32 mmol/L   Glucose, Bld 118 (H) 70 - 99 mg/dL   BUN 8 6 - 20 mg/dL   Creatinine, Ser 0.89 0.44 - 1.00 mg/dL   Calcium 8.2 (L) 8.9 - 10.3 mg/dL   GFR, Estimated >60 >60 mL/min   Anion gap 5 5 - 15    Studies/Results: US OB Comp Less 14 Wks  Addendum Date: 08/01/2020   ADDENDUM REPORT: 08/01/2020 13:05 ADDENDUM: Prior comparison did not load at the time of dictation. There is an ultrasound from 07/11/2020 showing intrauterine gestation with embryo and cardiac activity. Therefore, findings today are consistent with failed pregnancy. These results were discussed by telephone on 08/01/2020 at 1:05 pm to provider NICOLE NUGENT Electronically Signed   By: Macy Mis M.D.   On: 08/01/2020 13:05   Result Date: 08/01/2020 CLINICAL DATA:  Vaginal bleeding EXAM:  OBSTETRIC <14 WK [place] AND TRANSVAGINAL OB US TECHNIQUE: Both transabdominal and transvaginal ultrasound examinations were performed for complete evaluation of the gestation as well as the maternal uterus, adnexal regions, and pelvic cul-de-sac. Transvaginal technique was performed to assess early pregnancy. COMPARISON:  None. FINDINGS: Intrauterine gestational sac: Single Yolk sac:  Visualized. Embryo:  Not Visualized. Cardiac Activity: Not Visualized. MSD: 21.2 mm   7 w   0 d Subchorionic hemorrhage:  None visualized. Maternal uterus/adnexae: Unremarkable. Right corpus luteum is noted. There is trace free fluid. IMPRESSION: Intrauterine gestational sac with yolk sac identified. However, no embryo identified at this time. Follow-up ultrasound is recommended in 10 days to confirm viability. Trace free fluid. Electronically Signed: By: Macy Mis M.D. On: 08/01/2020 12:53   US OB Transvaginal  Addendum Date: 08/01/2020   ADDENDUM REPORT: 08/01/2020 13:05 ADDENDUM: Prior comparison did not load at the time of dictation. There is an ultrasound from 07/11/2020 showing intrauterine gestation with embryo and cardiac activity. Therefore, findings today are consistent with failed pregnancy. These results were discussed by telephone on 08/01/2020 at 1:05 pm to provider NICOLE NUGENT Electronically Signed   By: Macy Mis M.D.   On: 08/01/2020 13:05   Result Date: 08/01/2020 CLINICAL DATA:  Vaginal bleeding EXAM: OBSTETRIC <14 WK [place] AND TRANSVAGINAL OB US TECHNIQUE: Both transabdominal and transvaginal ultrasound examinations were performed for complete evaluation of the gestation as well as the maternal uterus, adnexal regions, and pelvic cul-de-sac. Transvaginal technique was performed to assess early pregnancy. COMPARISON:  None. FINDINGS: Intrauterine gestational sac: Single Yolk sac:  Visualized. Embryo:  Not Visualized. Cardiac Activity: Not Visualized. MSD: 21.2 mm   7 w   0 d Subchorionic hemorrhage:  None  visualized. Maternal uterus/adnexae: Unremarkable. Right corpus luteum is noted. There is trace free fluid. IMPRESSION: Intrauterine gestational sac with yolk sac identified. However, no embryo identified at this time. Follow-up ultrasound is recommended in 10 days to confirm viability. Trace free fluid. Electronically Signed: By: Macy Mis M.D. On: 08/01/2020 12:53   US OB LESS THAN 14 WEEKS WITH OB TRANSVAGINAL  Result Date: 07/11/2020 CLINICAL DATA:  Unknown dates, viability EXAM: OBSTETRIC <14 WK [place] AND TRANSVAGINAL OB US TECHNIQUE: Both transabdominal and transvaginal ultrasound examinations were performed for complete evaluation of the gestation as well as the maternal uterus, adnexal regions, and pelvic cul-de-sac. Transvaginal technique was performed to assess early pregnancy. COMPARISON:  Ultrasound 07/12/2011 FINDINGS: Intrauterine gestational sac: Single Yolk sac:  Visualized. Embryo:  Visualized. Cardiac Activity: Visualized. Heart Rate: 108 bpm CRL:  4.4  mm mm   6 w   1 d                  [place] EDC: 03/05/2021 Subchorionic hemorrhage:  None visualized. Maternal uterus/adnexae: Anteverted maternal uterus. Probable corpus luteum in the right ovary. No concerning adnexal lesions. Trace anechoic free fluid in the deep pelvis, nonspecific though often physiologic. IMPRESSION: Single intrauterine gestation at 6 weeks, 1 day by crown-rump length sonographic estimation. Trace anechoic free fluid in the deep pelvis. Electronically Signed   By: Lovena Le M.D.   On: 07/11/2020 15:48    Scheduled Meds:  prenatal multivitamin  1 tablet Oral Q1200   Continuous Infusions:  sodium chloride 50 mL/hr at 08/10/20 0539   PRN Meds:acetaminophen, diphenhydrAMINE, ibuprofen, oxyCODONE    LOS: 1 day   Donnamae Jude, MD 08/10/2020 7:57 AM

## 2020-08-10 NOTE — Progress Notes (Signed)
Patient is still symptomatic with dizziness and lightheadedness, no significant bleeding. Hgb is 7.0 after transfusion of 2 pRBCs overnight.  Counseled patient about transfusion for two further units of pRBCs, she agreed. This was ordered. RN is aware. Will continue inpatient observation for now.  Of note, ultrasound ordered to follow up and ensure to significant RPOCs.  Will follow up results and manage accordingly.   Verita Schneiders, MD, [place] for [employer], [place]

## 2020-08-11 ENCOUNTER — Encounter (HOSPITAL_COMMUNITY): Payer: Self-pay | Admitting: Obstetrics & Gynecology

## 2020-08-11 ENCOUNTER — Inpatient Hospital Stay (HOSPITAL_COMMUNITY): Payer: No Typology Code available for payment source | Admitting: Certified Registered Nurse Anesthetist

## 2020-08-11 ENCOUNTER — Other Ambulatory Visit (HOSPITAL_COMMUNITY): Payer: Self-pay

## 2020-08-11 ENCOUNTER — Encounter (HOSPITAL_COMMUNITY)
Admission: AD | Disposition: A | Discharge status: Home / Self Care | Payer: Self-pay | Source: Home / Self Care | Attending: Obstetrics & Gynecology

## 2020-08-11 DIAGNOSIS — O034 Incomplete spontaneous abortion without complication: Secondary | ICD-10-CM

## 2020-08-11 DIAGNOSIS — D500 Iron deficiency anemia secondary to blood loss (chronic): Secondary | ICD-10-CM

## 2020-08-11 HISTORY — PX: DILATION AND EVACUATION: SHX1459

## 2020-08-11 LAB — BASIC METABOLIC PANEL
Anion gap: 7 (ref 5–15)
BUN: 9 mg/dL (ref 6–20)
CO2: 22 mmol/L (ref 22–32)
Calcium: 8.1 mg/dL — ABNORMAL LOW (ref 8.9–10.3)
Chloride: 105 mmol/L (ref 98–111)
Creatinine, Ser: 0.9 mg/dL (ref 0.44–1.00)
GFR, Estimated: >60 mL/min (ref >60)
Glucose, Bld: 92 mg/dL (ref 70–99)
Potassium: 3.9 mmol/L (ref 3.5–5.1)
Sodium: 134 mmol/L — ABNORMAL LOW (ref 135–145)

## 2020-08-11 LAB — CBC
HCT: 28.5 % — ABNORMAL LOW (ref 36.0–46.0)
Hemoglobin: 9.8 g/dL — ABNORMAL LOW (ref 12.0–15.0)
MCH: 30.3 pg (ref 26.0–34.0)
MCHC: 34.4 g/dL (ref 30.0–36.0)
MCV: 88.2 fL (ref 80.0–100.0)
Platelets: 286 10*3/uL (ref 150–400)
RBC: 3.23 MIL/uL — ABNORMAL LOW (ref 3.87–5.11)
RDW: 17.1 % — ABNORMAL HIGH (ref 11.5–15.5)
WBC: 5.6 10*3/uL (ref 4.0–10.5)
nRBC: 0 % (ref 0.0–0.2)

## 2020-08-11 LAB — PREPARE RBC (CROSSMATCH)

## 2020-08-11 SURGERY — DILATION AND EVACUATION, UTERUS
Anesthesia: General

## 2020-08-11 MED ORDER — LACTATED RINGERS IV SOLN: INTRAVENOUS | Status: DC

## 2020-08-11 MED ORDER — PROPOFOL 500 MG/50ML IV EMUL
INTRAVENOUS | Status: DC | PRN
  Administered 2020-08-11: 150 ug/kg/min via INTRAVENOUS

## 2020-08-11 MED ORDER — PROPOFOL 10 MG/ML IV BOLUS
INTRAVENOUS | Status: AC
  Filled 2020-08-11: qty 20

## 2020-08-11 MED ORDER — DEXAMETHASONE SODIUM PHOSPHATE 10 MG/ML IJ SOLN
INTRAMUSCULAR | Status: DC | PRN
  Administered 2020-08-11: 4 mg via INTRAVENOUS

## 2020-08-11 MED ORDER — LIDOCAINE 2% (20 MG/ML) 5 ML SYRINGE
INTRAMUSCULAR | Status: AC
  Filled 2020-08-11: qty 5

## 2020-08-11 MED ORDER — CHLORHEXIDINE GLUCONATE 0.12 % MT SOLN: 15.0000 mL | Freq: Once | OROMUCOSAL | Status: AC

## 2020-08-11 MED ORDER — FENTANYL CITRATE (PF) 100 MCG/2ML IJ SOLN
INTRAMUSCULAR | Status: DC | PRN
  Administered 2020-08-11: 50 ug via INTRAVENOUS

## 2020-08-11 MED ORDER — ORAL CARE MOUTH RINSE: 15.0000 mL | Freq: Once | OROMUCOSAL | Status: AC

## 2020-08-11 MED ORDER — ONDANSETRON HCL 4 MG/2ML IJ SOLN: 4.0000 mg | Freq: Four times a day (QID) | INTRAMUSCULAR | Status: DC | PRN

## 2020-08-11 MED ORDER — LIDOCAINE 2% (20 MG/ML) 5 ML SYRINGE
INTRAMUSCULAR | Status: DC | PRN
  Administered 2020-08-11: 100 mg via INTRAVENOUS

## 2020-08-11 MED ORDER — PROPOFOL 10 MG/ML IV BOLUS
INTRAVENOUS | Status: DC | PRN
  Administered 2020-08-11: 200 mg via INTRAVENOUS

## 2020-08-11 MED ORDER — ONDANSETRON HCL 4 MG PO TABS: 4.0000 mg | ORAL_TABLET | Freq: Four times a day (QID) | ORAL | Status: DC | PRN

## 2020-08-11 MED ORDER — MIDAZOLAM HCL 2 MG/2ML IJ SOLN
INTRAMUSCULAR | Status: AC
  Filled 2020-08-11: qty 2

## 2020-08-11 MED ORDER — SENNOSIDES-DOCUSATE SODIUM 8.6-50 MG PO TABS
1.0000 | ORAL_TABLET | Freq: Every evening | ORAL | Status: DC | PRN
  Filled 2020-08-11: qty 1

## 2020-08-11 MED ORDER — DOCUSATE SODIUM 100 MG PO CAPS
100.0000 mg | ORAL_CAPSULE | Freq: Two times a day (BID) | ORAL | 0 refills | Status: DC | PRN
  Filled 2020-08-11: qty 30, 15d supply, fill #0

## 2020-08-11 MED ORDER — BUPIVACAINE HCL 0.5 % IJ SOLN
INTRAMUSCULAR | Status: DC | PRN
  Administered 2020-08-11: 30 mL

## 2020-08-11 MED ORDER — MIDAZOLAM HCL 5 MG/5ML IJ SOLN
INTRAMUSCULAR | Status: DC | PRN
  Administered 2020-08-11: 2 mg via INTRAVENOUS

## 2020-08-11 MED ORDER — KETOROLAC TROMETHAMINE 30 MG/ML IJ SOLN
INTRAMUSCULAR | Status: AC
  Filled 2020-08-11: qty 1

## 2020-08-11 MED ORDER — ARTIFICIAL TEARS OPHTHALMIC OINT
TOPICAL_OINTMENT | OPHTHALMIC | Status: AC
  Filled 2020-08-11: qty 3.5

## 2020-08-11 MED ORDER — ONDANSETRON HCL 4 MG/2ML IJ SOLN
INTRAMUSCULAR | Status: AC
  Filled 2020-08-11: qty 2

## 2020-08-11 MED ORDER — SILVER NITRATE-POT NITRATE 75-25 % EX MISC
CUTANEOUS | Status: AC
  Filled 2020-08-11: qty 10

## 2020-08-11 MED ORDER — KETOROLAC TROMETHAMINE 30 MG/ML IJ SOLN
INTRAMUSCULAR | Status: DC | PRN
  Administered 2020-08-11: 30 mg via INTRAVENOUS

## 2020-08-11 MED ORDER — OXYCODONE HCL 5 MG PO TABS
5.0000 mg | ORAL_TABLET | ORAL | 0 refills | Status: DC | PRN
  Filled 2020-08-11: qty 20, 4d supply, fill #0

## 2020-08-11 MED ORDER — CHLORHEXIDINE GLUCONATE 0.12 % MT SOLN
OROMUCOSAL | Status: AC
  Administered 2020-08-11: 15 mL
  Filled 2020-08-11: qty 15

## 2020-08-11 MED ORDER — DOXYCYCLINE HYCLATE 100 MG IV SOLR
200.0000 mg | INTRAVENOUS | Status: AC
  Administered 2020-08-11: 200 mg via INTRAVENOUS
  Filled 2020-08-11: qty 200

## 2020-08-11 MED ORDER — DEXAMETHASONE SODIUM PHOSPHATE 10 MG/ML IJ SOLN
INTRAMUSCULAR | Status: AC
  Filled 2020-08-11: qty 1

## 2020-08-11 MED ORDER — PROPOFOL 1000 MG/100ML IV EMUL
INTRAVENOUS | Status: AC
  Filled 2020-08-11: qty 100

## 2020-08-11 MED ORDER — IBUPROFEN 600 MG PO TABS
600.0000 mg | ORAL_TABLET | Freq: Four times a day (QID) | ORAL | 2 refills | Status: DC | PRN
  Filled 2020-08-11: qty 30, 8d supply, fill #0

## 2020-08-11 MED ORDER — FENTANYL CITRATE (PF) 250 MCG/5ML IJ SOLN
INTRAMUSCULAR | Status: AC
  Filled 2020-08-11: qty 5

## 2020-08-11 MED ORDER — BUPIVACAINE HCL (PF) 0.5 % IJ SOLN
INTRAMUSCULAR | Status: AC
  Filled 2020-08-11: qty 30

## 2020-08-11 MED ORDER — DEXMEDETOMIDINE (PRECEDEX) IN NS 20 MCG/5ML (4 MCG/ML) IV SYRINGE
PREFILLED_SYRINGE | INTRAVENOUS | Status: DC | PRN
  Administered 2020-08-11: 20 ug via INTRAVENOUS

## 2020-08-11 MED ORDER — ONDANSETRON HCL 4 MG/2ML IJ SOLN
INTRAMUSCULAR | Status: DC | PRN
  Administered 2020-08-11: 4 mg via INTRAVENOUS

## 2020-08-11 MED FILL — Phentermine HCl Tab 37.5 MG: ORAL | 30 days supply | Qty: 30 | Fill #0 | Status: CN

## 2020-08-11 SURGICAL SUPPLY — 17 items
CATH ROBINSON RED A/P 16FR (CATHETERS) ×2 IMPLANT
DECANTER SPIKE VIAL GLASS SM (MISCELLANEOUS) ×2 IMPLANT
FILTER UTR ASPR ASSEMBLY (MISCELLANEOUS) ×2 IMPLANT
GLOVE SURG LTX SZ7 (GLOVE) ×2 IMPLANT
GLOVE SURG UNDER POLY LF SZ7 (GLOVE) ×2 IMPLANT
GOWN STRL REUS W/ TWL LRG LVL3 (GOWN DISPOSABLE) ×2 IMPLANT
GOWN STRL REUS W/TWL LRG LVL3 (GOWN DISPOSABLE) ×4
HOSE CONNECTING 18IN BERKELEY (TUBING) ×2 IMPLANT
KIT BERKELEY 1ST TRI 3/8 NO TR (MISCELLANEOUS) ×2 IMPLANT
KIT BERKELEY 1ST TRIMESTER 3/8 (MISCELLANEOUS) ×2 IMPLANT
NS IRRIG 1000ML POUR BTL (IV SOLUTION) ×2 IMPLANT
PACK VAGINAL MINOR WOMEN LF (CUSTOM PROCEDURE TRAY) ×2 IMPLANT
PAD OB MATERNITY 4.3X12.25 (PERSONAL CARE ITEMS) ×2 IMPLANT
SET BERKELEY SUCTION TUBING (SUCTIONS) ×2 IMPLANT
TOWEL GREEN STERILE FF (TOWEL DISPOSABLE) ×4 IMPLANT
UNDERPAD 30X36 HEAVY ABSORB (UNDERPADS AND DIAPERS) ×2 IMPLANT
VACURETTE 8 RIGID CVD (CANNULA) ×1 IMPLANT

## 2020-08-11 NOTE — Discharge Summary (Signed)
Gynecology Physician Discharge Summary  Patient ID: Karen Jordan MRN: [id number] DOB/AGE: 33/07/1985 33 y.o.  Admit Date: 08/09/2020 Discharge Date: 08/11/2020  Admission and Preoperative Diagnoses: Principal Problem:   Incomplete abortion with complication Active Problems:   Blood loss anemia  Discharge Diagnoses:  Completed abortion  Procedures:  08/10/2020  Blood transfusion with four units of pRBCs 08/11/2020 DILATATION AND EVACUATION  Hospital Course:  Karen Jordan is a 35 y.o. [id number]  admitted for symptomatic anemia die to heavy bleeding in the setting incomplete abortion. She had a missed abortion and was treated with misoprostol, this led to severe bleeding and patient had a hemoglobin of 6.3.  She was very symptomatic and she was admitted for blood transfusion. Her bleeding subsided, and she tolerated the first two units of pRBCs without complication. However, her hemoglobin only increased to 7.0 and patient was still symptomatic.  Two additional units of PRBCs were transfused, post transfusion hemoglobin was 9.8.  Ultrasound was ordered to confirm completed abortion, but this showed retained products despite all the bleeding.  At this point, her bleeding was not much and she was counseled about second dose of misoprostol vs Dilation and Evacuation, she opted for surgical management.  She was scheduled for Dilation and Evacuation on 08/11/2020.   She underwent the procedure and this was uncomplicated. For further details about surgery, please refer to the operative report. Patient had an uncomplicated postoperative course, and was stable for discharge to home after recovery. By time of discharge on 08/11/2020, her pain was controlled on oral pain medications; she was ambulating, voiding without difficulty, and tolerating regular diet. She was deemed stable for discharge to home with outpatient follow up.   Significant Studies: CBC Latest Ref Rng & Units 08/11/2020 08/10/2020  08/09/2020  WBC 4.0 - 10.5 K/uL 5.6 - 6.2  Hemoglobin 12.0 - 15.0 g/dL 9.8(L) 7.0(L) 6.3(LL)  Hematocrit 36.0 - 46.0 % 28.5(L) 20.5(L) 19.2(L)  Platelets 150 - 400 K/uL 286 - 390   US OB Transvaginal  Result Date: 08/10/2020 CLINICAL DATA:  Incomplete miscarriage. Bleeding and cramping. Quantitative beta HCG was 29,453 on 08/01/2020. Unknown LMP. By first ultrasound patient would be 10 weeks 3 days. EXAM: TRANSVAGINAL OB ULTRASOUND TECHNIQUE: Transvaginal ultrasound was performed for complete evaluation of the gestation as well as the maternal uterus, adnexal regions, and pelvic cul-de-sac. COMPARISON:  08/01/2020 FINDINGS: Intrauterine gestational sac: None Yolk sac:  Not Visualized. Embryo:  Not Visualized. Cardiac Activity: Not Visualized. Uterus: Heterogeneous endometrial contents in the LOWER uterine segment measures 3.8 x 2.4 x 2.9 centimeters. Endometrial contents are vascular on Doppler evaluation. No free pelvic fluid. Maternal uterus/adnexae: RIGHT corpus luteum seen. LEFT ovary is normal in appearance. IMPRESSION: Findings are consistent with incomplete abortion. No normal intrauterine or adnexal pregnancy identified. No free pelvic fluid. Electronically Signed   By: Nolon Nations M.D.   On: 08/10/2020 11:11     Discharge Exam: Blood pressure 108/74, pulse 63, temperature 97.8 F (36.6 C), temperature source Oral, resp. rate 16, height 5\' 4"  (1.626 m), weight 179 lb 4.8 oz (81.3 kg), SpO2 97 %. General appearance: alert and no distress  Resp: normal breath sounds, no respiratory difficulties noted Cardio: regular rate and rhythm  GI: soft, non-tender; bowel sounds normal; no masses, no organomegaly.  Pelvic: scant blood on pad  Extremities: extremities normal, atraumatic, no cyanosis or edema and Homans sign is negative, no sign of DVT  Discharged Condition: Stable  Discharge disposition: 01-Home or Self Care   Allergies  as of 08/11/2020       Reactions   Amoxicillin Rash    Has patient had a PCN reaction causing immediate rash, facial/tongue/throat swelling, SOB or lightheadedness with hypotension: Yes Has patient had a PCN reaction causing severe rash involving mucus membranes or skin necrosis: Unknown Has patient had a PCN reaction that required hospitalization: No Has patient had a PCN reaction occurring within the last 10 years: No Childhood reaction. If all of the above answers are "NO", then may proceed with Cephalosporin use.        Medication List     STOP taking these medications    fluconazole 150 MG tablet Commonly known as: DIFLUCAN   misoprostol 200 MCG tablet Commonly known as: Cytotec   promethazine 25 MG tablet Commonly known as: PHENERGAN       TAKE these medications    acetaminophen 325 MG tablet Commonly known as: Tylenol Take 2 tablets (650 mg total) by mouth every 6 (six) hours as needed.   docusate sodium 100 MG capsule Commonly known as: Colace Take 1 capsule (100 mg total) by mouth 2 (two) times daily as needed for mild constipation or moderate constipation.   FLUoxetine 20 MG capsule Commonly known as: PROZAC TAKE 1 CAPSULE (20 MG TOTAL) BY MOUTH DAILY.   ibuprofen 600 MG tablet Commonly known as: ADVIL Take 1 tablet (600 mg total) by mouth every 6 (six) hours as needed for moderate pain or cramping.   oxyCODONE 5 MG immediate release tablet Commonly known as: Roxicodone Take 1 tablet (5 mg total) by mouth every 4 (four) hours as needed for severe pain or breakthrough pain. What changed: reasons to take this   phentermine 37.5 MG tablet Commonly known as: ADIPEX-P TAKE 1 TABLET (37.5 MG TOTAL) BY MOUTH EVERY MORNING.   triamcinolone ointment 0.1 % Commonly known as: KENALOG APPLY TO THE AFFECTED AREA(S) TWO TIMES DAILY FOR 1-2 WEEKS       Future Appointments  Date Time Provider [place]  08/17/2020  3:00 PM Laury Deep, CNM [facility] None     Total discharge time: 30 minutes      Signed:  Verita Schneiders, MD, [place] for [employer], [place]

## 2020-08-11 NOTE — Transfer of Care (Signed)
Immediate Anesthesia Transfer of Care Note  Patient: Karen Jordan  Procedure(s) Performed: DILATATION AND EVACUATION  Patient Location: PACU  Anesthesia Type:General  Level of Consciousness: drowsy and patient cooperative  Airway & Oxygen Therapy: Patient Spontanous Breathing and Patient connected to nasal cannula oxygen  Post-op Assessment: Report given to RN and Post -op Vital signs reviewed and stable  Post vital signs: Reviewed and stable  Last Vitals:  Vitals Value Taken Time  BP 100/66 08/11/20 1348  Temp    Pulse 78 08/11/20 1348  Resp 13 08/11/20 1348  SpO2 100 % 08/11/20 1348  Vitals shown include unvalidated device data.  Last Pain:  Vitals:   08/11/20 1236  TempSrc:   PainSc: 0-No pain      Patients Stated Pain Goal: 0 (32/35/57 3220)  Complications: No notable events documented.

## 2020-08-11 NOTE — Progress Notes (Signed)
Pt given discharge instructions. Pt verbalized understanding. All questions answered. IV's were discontinued.

## 2020-08-11 NOTE — Progress Notes (Signed)
Faculty Practice OB/GYN Attending Note  Subjective:  Patient did well overnight, minimal bleeding, no passage of clots or products of conception. Still on schedule for D&E this afternoon. Has been NPO since midnight. Denies any lightheadedness, dizziness, fevers, chills, sweats, dysuria, nausea, vomiting, other GI or GU symptoms or other general symptoms.  Admitted on 08/09/2020 for Incomplete abortion with complication/hemorrhage.    Objective:  Blood pressure 122/73, pulse 69, temperature (!) 97.5 F (36.4 C), temperature source Oral, resp. rate 18, height 5\' 4"  (1.626 m), weight 81.3 kg, SpO2 100 %. Gen: NAD HENT: Normocephalic, atraumatic Lungs: Normal respiratory effort Heart: Regular rate noted Abdomen: NT, soft Cervix: Deferred Ext: 2+ DTRs, no edema, no cyanosis, negative Homan's sign  Labs: CBC Latest Ref Rng & Units 08/11/2020 08/10/2020 08/09/2020  WBC 4.0 - 10.5 K/uL 5.6 - 6.2  Hemoglobin 12.0 - 15.0 g/dL 9.8(L) 7.0(L) 6.3(LL)  Hematocrit 36.0 - 46.0 % 28.5(L) 20.5(L) 19.2(L)  Platelets 150 - 400 K/uL 286 - 390   BMP Latest Ref Rng & Units 08/11/2020 08/09/2020 10/06/2019  Glucose 70 - 99 mg/dL 92 118(H) 84  BUN 6 - 20 mg/dL 9 8 13   Creatinine 0.44 - 1.00 mg/dL 0.90 0.89 0.65  BUN/Creat Ratio 9 - 23 - - -  Sodium 135 - 145 mmol/L 134(L) 137 139  Potassium 3.5 - 5.1 mmol/L 3.9 3.8 3.8  Chloride 98 - 111 mmol/L 105 109 108  CO2 22 - 32 mmol/L 22 23 20(L)  Calcium 8.9 - 10.3 mg/dL 8.1(L) 8.2(L) 8.8(L)   Imaging: US OB Transvaginal  Result Date: 08/10/2020 CLINICAL DATA:  Incomplete miscarriage. Bleeding and cramping. Quantitative beta HCG was 29,453 on 08/01/2020. Unknown LMP. By first ultrasound patient would be 10 weeks 3 days. EXAM: TRANSVAGINAL OB ULTRASOUND TECHNIQUE: Transvaginal ultrasound was performed for complete evaluation of the gestation as well as the maternal uterus, adnexal regions, and pelvic cul-de-sac. COMPARISON:  08/01/2020 FINDINGS: Intrauterine  gestational sac: None Yolk sac:  Not Visualized. Embryo:  Not Visualized. Cardiac Activity: Not Visualized. Uterus: Heterogeneous endometrial contents in the LOWER uterine segment measures 3.8 x 2.4 x 2.9 centimeters. Endometrial contents are vascular on Doppler evaluation. No free pelvic fluid. Maternal uterus/adnexae: RIGHT corpus luteum seen. LEFT ovary is normal in appearance. IMPRESSION: Findings are consistent with incomplete abortion. No normal intrauterine or adnexal pregnancy identified. No free pelvic fluid. Electronically Signed   By: Nolon Nations M.D.   On: 08/10/2020 11:11    Assessment & Plan:  35 y.o. [id number] admitted for symptomatic anemia in the setting of incomplete miscarriage; has received 4 units of pRBCs. Hemoglobin went from 6.3>9.8. No anemia symptoms currently.  As for her incomplete miscarriage, ultrasound showed products still in her uterus.  Patient declined second dose of misoprostol, desires Dilation and Evacuation.   Risks of surgery including bleeding, infection, injury to surrounding organs, need for additional procedures, possibility of intrauterine scarring which may impair future fertility, risk of retained products which may require further management and other postoperative/anesthesia complications were explained to patient.  Likelihood of success of complete evacuation of the uterus was discussed with the patient.  Written informed consent was obtained.  Patient has been NPO since midnight  she will remain NPO for procedure. Anesthesia and OR aware.  Preoperative prophylactic Doxycycline 200mg  IV has been ordered will be given before case around 1200.   To OR when ready.     Verita Schneiders, MD, [place] for [employer], [facility]

## 2020-08-11 NOTE — Op Note (Signed)
Karen Jordan PROCEDURE DATE: 08/11/2020  PREOPERATIVE DIAGNOSIS: Incomplete abortion POSTOPERATIVE DIAGNOSIS: The same PROCEDURE:     Dilation and Evacuation SURGEON:  Dr. Verita Schneiders  INDICATIONS: 35 y.o. [id number] with incomplete abortion complicated by hemorrhage and symptomatic anemia necessitating blood transfusion, needing surgical completion.  Risks of surgery were discussed with the patient including but not limited to: bleeding which may require transfusion; infection which may require antibiotics; injury to uterus or surrounding organs; need for additional procedures including laparotomy or laparoscopy; possibility of intrauterine scarring which may impair future fertility; and other postoperative/anesthesia complications. Written informed consent was obtained.    FINDINGS:  A 8 week size uterus, moderate amounts of products of conception, specimen sent to pathology.  ANESTHESIA: General-LMA, paracervical block. ESTIMATED BLOOD LOSS: 580 ml. SPECIMENS:  Products of conception sent to pathology COMPLICATIONS:  None immediate.  PROCEDURE DETAILS:  The patient received intravenous Doxycycline while in the preoperative area.  She was then taken to the operating room where general anesthesia was administered and was found to be adequate.  After an adequate timeout was performed, she was placed in the dorsal lithotomy position and examined; then prepped and draped in the sterile manner.   Her bladder was catheterized for an unmeasured amount of clear, yellow urine. A vaginal speculum was then placed in the patient's vagina and a single tooth tenaculum was applied to the anterior lip of the cervix.  A paracervical block using 30 ml of 0.5% Marcaine was administered. The cervix was gently dilated to accommodate a 8 mm suction curette that was gently advanced to the uterine fundus.  The suction device was then activated and curette slowly rotated to clear the uterus of products of  conception.. There was minimal bleeding noted and the tenaculum removed with good hemostasis noted.   All instruments were removed from the patient's vagina.  Sponge and instrument counts were correct times three. The patient tolerated the procedure well and was taken to the recovery area extubated, awake, and in stable condition.  The patient will be discharged to home later today.  Routine postoperative instructions given.  She was prescribed Percocet, Ibuprofen and Colace.  She will follow up in the office as scheduled for postoperative evaluation.   Verita Schneiders, MD, [place] for [employer], [place]

## 2020-08-11 NOTE — Anesthesia Procedure Notes (Signed)
Procedure Name: LMA Insertion Date/Time: 08/11/2020 1:09 PM Performed by: Lowella Dell, CRNA Pre-anesthesia Checklist: Patient identified, Emergency Drugs available, Suction available and Patient being monitored Patient Re-evaluated:Patient Re-evaluated prior to induction Oxygen Delivery Method: Circle System Utilized Preoxygenation: Pre-oxygenation with 100% oxygen Induction Type: IV induction Ventilation: Mask ventilation without difficulty LMA: LMA inserted LMA Size: 3.0 Number of attempts: 1 Airway Equipment and Method: Bite block Placement Confirmation: positive ETCO2 Tube secured with: Tape Dental Injury: Teeth and Oropharynx as per pre-operative assessment

## 2020-08-11 NOTE — Progress Notes (Signed)
Pt discharged in stable condition via wheelchair. Pt sent home with all belongings.

## 2020-08-11 NOTE — Anesthesia Postprocedure Evaluation (Signed)
Anesthesia Post Note  Patient: Karen Jordan  Procedure(s) Performed: DILATATION AND EVACUATION     Patient location during evaluation: PACU Anesthesia Type: General Level of consciousness: awake and alert Pain management: pain level controlled Vital Signs Assessment: post-procedure vital signs reviewed and stable Respiratory status: spontaneous breathing, nonlabored ventilation and respiratory function stable Cardiovascular status: blood pressure returned to baseline and stable Postop Assessment: no apparent nausea or vomiting Anesthetic complications: no   No notable events documented.  Last Vitals:  Vitals:   08/11/20 1117 08/11/20 1348  BP: 111/70 100/66  Pulse: 76 78  Resp: 18 13  Temp: 37.1 C 36.9 C  SpO2: 100% 100%    Last Pain:  Vitals:   08/11/20 1348  TempSrc:   PainSc: 0-No pain                 Lidia Collum

## 2020-08-11 NOTE — Anesthesia Preprocedure Evaluation (Signed)
Anesthesia Evaluation  Patient identified by MRN, date of birth, ID band Patient awake    Reviewed: Allergy & Precautions, NPO status , Patient's Chart, lab work & pertinent test results  History of Anesthesia Complications Negative for: history of anesthetic complications  Airway Mallampati: II  TM Distance: >3 FB Neck ROM: Full    Dental  (+) Teeth Intact, Dental Advisory Given   Pulmonary asthma , former smoker,    Pulmonary exam normal        Cardiovascular negative cardio ROS Normal cardiovascular exam     Neuro/Psych Anxiety Depression negative neurological ROS     GI/Hepatic negative GI ROS, Neg liver ROS,   Endo/Other  negative endocrine ROS  Renal/GU negative Renal ROS  negative genitourinary   Musculoskeletal negative musculoskeletal ROS (+)   Abdominal   Peds  Hematology  (+) anemia ,   Anesthesia Other Findings   Reproductive/Obstetrics 10 week SAB                           Anesthesia Physical Anesthesia Plan  ASA: 3  Anesthesia Plan: General   Post-op Pain Management:    Induction: Intravenous  PONV Risk Score and Plan: 3 and Ondansetron, Dexamethasone, Midazolam and Treatment may vary due to age or medical condition  Airway Management Planned: LMA  Additional Equipment: None  Intra-op Plan:   Post-operative Plan: Extubation in OR  Informed Consent: I have reviewed the patients History and Physical, chart, labs and discussed the procedure including the risks, benefits and alternatives for the proposed anesthesia with the patient or authorized representative who has indicated his/her understanding and acceptance.     Dental advisory given  Plan Discussed with:   Anesthesia Plan Comments:         Anesthesia Quick Evaluation

## 2020-08-12 ENCOUNTER — Encounter (HOSPITAL_COMMUNITY): Payer: Self-pay | Admitting: Obstetrics & Gynecology

## 2020-08-13 LAB — BPAM RBC
Blood Product Expiration Date: 202207122359
Blood Product Expiration Date: 202207122359
Blood Product Expiration Date: 202207132359
Blood Product Expiration Date: 202207132359
Blood Product Expiration Date: 202207162359
Blood Product Expiration Date: 202207162359
ISSUE DATE / TIME: 202206230013
ISSUE DATE / TIME: 202206230315
ISSUE DATE / TIME: 202206231151
ISSUE DATE / TIME: 202206231531
Unit Type and Rh: 6200
Unit Type and Rh: 6200
Unit Type and Rh: 6200
Unit Type and Rh: 6200
Unit Type and Rh: 6200
Unit Type and Rh: 6200

## 2020-08-13 LAB — TYPE AND SCREEN
ABO/RH(D): A POS
Antibody Screen: NEGATIVE
Unit division: 0
Unit division: 0
Unit division: 0
Unit division: 0
Unit division: 0
Unit division: 0

## 2020-08-14 LAB — SURGICAL PATHOLOGY

## 2020-08-17 ENCOUNTER — Ambulatory Visit: Payer: No Typology Code available for payment source | Admitting: Obstetrics and Gynecology

## 2020-08-22 ENCOUNTER — Other Ambulatory Visit (HOSPITAL_COMMUNITY): Payer: Self-pay

## 2020-08-23 ENCOUNTER — Other Ambulatory Visit: Payer: Self-pay

## 2020-08-23 ENCOUNTER — Encounter: Payer: Self-pay | Admitting: Obstetrics and Gynecology

## 2020-08-23 ENCOUNTER — Ambulatory Visit (INDEPENDENT_AMBULATORY_CARE_PROVIDER_SITE_OTHER): Payer: No Typology Code available for payment source | Admitting: Obstetrics and Gynecology

## 2020-08-23 VITALS — BP 122/72 | HR 84 | Temp 98.1°F | Ht 64.0 in | Wt 178.0 lb

## 2020-08-23 DIAGNOSIS — O0330 Unspecified complication following incomplete spontaneous abortion: Secondary | ICD-10-CM

## 2020-08-23 DIAGNOSIS — D500 Iron deficiency anemia secondary to blood loss (chronic): Secondary | ICD-10-CM

## 2020-08-23 MED ORDER — FERROUS SULFATE 325 (65 FE) MG PO TBEC
325.0000 mg | DELAYED_RELEASE_TABLET | ORAL | 3 refills | Status: DC
  Filled 2020-08-23: qty 60, 120d supply, fill #0

## 2020-08-23 MED ORDER — ASCORBIC ACID 500 MG PO TABS
500.0000 mg | ORAL_TABLET | ORAL | 3 refills | Status: DC
  Filled 2020-08-23: qty 30, 60d supply, fill #0

## 2020-08-23 NOTE — Addendum Note (Signed)
Addended by: Graceann Congress on: 08/23/2020 08:40 PM   Modules accepted: Orders

## 2020-08-23 NOTE — Progress Notes (Signed)
  GYNECOLOGY PROGRESS NOTE  History:  Ms. Karen Jordan is a 35 y.o. [id number] presents to [facility] office today for problem gyn visit. She reports no VB. She desires to TTC again, but "not now; I'm afraid." She has the [facility] patch at home and plans to place one on Monday 08/28/2020. She takes Prozac on a prn basis. She denies h/a, dizziness, shortness of breath, n/v, or fever/chills.    The following portions of the patient's history were reviewed and updated as appropriate: allergies, current medications, past family history, past medical history, past social history, past surgical history and problem list. Last pap smear on 11/2019 was normal, with negative HRHPV per patient.  Review of Systems:  Pertinent items are noted in HPI.   Objective:  Physical Exam Blood pressure 122/72, pulse 84, temperature 98.1 F (36.7 C), temperature source Oral, height 5\' 4"  (1.626 m), weight 178 lb (80.7 kg), unknown if currently breastfeeding. VS reviewed, nursing note reviewed,  Constitutional: well developed, well nourished, no distress HEENT: normocephalic CV: normal rate Pulm/chest wall: normal effort Breast Exam: deferred Abdomen: soft Neuro: alert and oriented x 3 Skin: warm, dry Psych: affect normal Pelvic exam: deferred  Assessment & Plan:  1. Incomplete abortion with complication - Beta hCG quant (ref lab) - No VB - Send MyChart message for patch refills - Return in 11/2020 for AEX  2. Blood loss anemia - CBC   Laury Deep, CNM 4:45 PM

## 2020-08-24 ENCOUNTER — Other Ambulatory Visit (HOSPITAL_COMMUNITY): Payer: Self-pay

## 2020-08-24 LAB — CBC
Hematocrit: 35.5 % (ref 34.0–46.6)
Hemoglobin: 11.7 g/dL (ref 11.1–15.9)
MCH: 30.8 pg (ref 26.6–33.0)
MCHC: 33 g/dL (ref 31.5–35.7)
MCV: 93 fL (ref 79–97)
Platelets: 294 10*3/uL (ref 150–450)
RBC: 3.8 x10E6/uL (ref 3.77–5.28)
RDW: 14 % (ref 11.7–15.4)
WBC: 4.2 10*3/uL (ref 3.4–10.8)

## 2020-08-24 LAB — BETA HCG QUANT (REF LAB): hCG Quant: 23 m[IU]/mL

## 2020-09-22 ENCOUNTER — Other Ambulatory Visit (HOSPITAL_COMMUNITY): Payer: Self-pay

## 2020-09-22 ENCOUNTER — Other Ambulatory Visit: Payer: Self-pay

## 2020-09-22 MED ORDER — VALACYCLOVIR HCL 1 G PO TABS
ORAL_TABLET | ORAL | 2 refills | Status: DC
  Filled 2020-09-22: qty 11, 8d supply, fill #0
  Filled 2021-03-07: qty 11, 8d supply, fill #1
  Filled 2021-09-11: qty 11, 8d supply, fill #2

## 2020-09-22 MED FILL — Phentermine HCl Tab 37.5 MG: ORAL | 30 days supply | Qty: 30 | Fill #0 | Status: AC

## 2020-09-29 ENCOUNTER — Other Ambulatory Visit (HOSPITAL_COMMUNITY): Payer: Self-pay

## 2020-09-29 ENCOUNTER — Other Ambulatory Visit: Payer: Self-pay | Admitting: Nurse Practitioner

## 2020-09-29 MED ORDER — PHENTERMINE HCL 37.5 MG PO TABS
37.5000 mg | ORAL_TABLET | Freq: Every morning | ORAL | 1 refills | Status: DC
  Filled 2020-09-29 – 2020-11-03 (×2): qty 30, 30d supply, fill #0
  Filled 2021-03-07: qty 30, 30d supply, fill #1

## 2020-10-19 ENCOUNTER — Other Ambulatory Visit (HOSPITAL_COMMUNITY): Payer: Self-pay

## 2020-10-19 ENCOUNTER — Other Ambulatory Visit: Payer: Self-pay | Admitting: Obstetrics and Gynecology

## 2020-10-19 DIAGNOSIS — Z3040 Encounter for surveillance of contraceptives, unspecified: Secondary | ICD-10-CM

## 2020-10-19 MED ORDER — NORELGESTROMIN-ETH ESTRADIOL 150-35 MCG/24HR TD PTWK
1.0000 | MEDICATED_PATCH | TRANSDERMAL | 12 refills | Status: DC
  Filled 2020-10-19 – 2020-11-03 (×2): qty 3, 28d supply, fill #0
  Filled 2020-11-29 – 2020-12-08 (×2): qty 6, 56d supply, fill #1
  Filled 2021-01-22: qty 9, 84d supply, fill #2
  Filled 2021-04-16 – 2021-04-24 (×2): qty 9, 84d supply, fill #3

## 2020-10-19 NOTE — Progress Notes (Signed)
Notified of Rx via [place]

## 2020-10-27 ENCOUNTER — Other Ambulatory Visit (HOSPITAL_COMMUNITY): Payer: Self-pay

## 2020-11-03 ENCOUNTER — Other Ambulatory Visit (HOSPITAL_COMMUNITY): Payer: Self-pay

## 2020-11-29 ENCOUNTER — Other Ambulatory Visit (HOSPITAL_COMMUNITY): Payer: Self-pay

## 2020-12-01 ENCOUNTER — Ambulatory Visit: Payer: No Typology Code available for payment source | Attending: Nurse Practitioner | Admitting: Nurse Practitioner

## 2020-12-01 ENCOUNTER — Encounter: Payer: No Typology Code available for payment source | Admitting: Nurse Practitioner

## 2020-12-01 ENCOUNTER — Encounter: Payer: Self-pay | Admitting: Nurse Practitioner

## 2020-12-01 ENCOUNTER — Other Ambulatory Visit: Payer: Self-pay

## 2020-12-01 VITALS — BP 113/82 | HR 101 | Wt 182.5 lb

## 2020-12-01 DIAGNOSIS — D500 Iron deficiency anemia secondary to blood loss (chronic): Secondary | ICD-10-CM | POA: Diagnosis not present

## 2020-12-01 DIAGNOSIS — Z1159 Encounter for screening for other viral diseases: Secondary | ICD-10-CM

## 2020-12-01 DIAGNOSIS — Z0000 Encounter for general adult medical examination without abnormal findings: Secondary | ICD-10-CM

## 2020-12-01 DIAGNOSIS — E669 Obesity, unspecified: Secondary | ICD-10-CM

## 2020-12-01 DIAGNOSIS — Z6831 Body mass index (BMI) 31.0-31.9, adult: Secondary | ICD-10-CM | POA: Diagnosis not present

## 2020-12-01 DIAGNOSIS — Z0001 Encounter for general adult medical examination with abnormal findings: Secondary | ICD-10-CM | POA: Diagnosis not present
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
## 2020-12-07 ENCOUNTER — Other Ambulatory Visit (INDEPENDENT_AMBULATORY_CARE_PROVIDER_SITE_OTHER): Payer: Self-pay | Admitting: Primary Care

## 2020-12-07 DIAGNOSIS — R300 Dysuria: Secondary | ICD-10-CM

## 2020-12-07 MED ORDER — SULFAMETHOXAZOLE-TRIMETHOPRIM 800-160 MG PO TABS
1.0000 | ORAL_TABLET | Freq: Two times a day (BID) | ORAL | 0 refills | Status: DC
  Filled 2020-12-07: qty 14, 7d supply, fill #0

## 2020-12-08 ENCOUNTER — Other Ambulatory Visit (HOSPITAL_COMMUNITY): Payer: Self-pay

## 2020-12-08 LAB — CMP14+EGFR
ALT: 12 IU/L (ref 0–32)
AST: 13 IU/L (ref 0–40)
Albumin/Globulin Ratio: 1.8 (ref 1.2–2.2)
Albumin: 4.5 g/dL (ref 3.8–4.8)
Alkaline Phosphatase: 38 IU/L — ABNORMAL LOW (ref 44–121)
BUN/Creatinine Ratio: 12 (ref 9–23)
BUN: 9 mg/dL (ref 6–20)
Bilirubin Total: 0.3 mg/dL (ref 0.0–1.2)
CO2: 17 mmol/L — ABNORMAL LOW (ref 20–29)
Calcium: 9.2 mg/dL (ref 8.7–10.2)
Chloride: 107 mmol/L — ABNORMAL HIGH (ref 96–106)
Creatinine, Ser: 0.74 mg/dL (ref 0.57–1.00)
Globulin, Total: 2.5 g/dL (ref 1.5–4.5)
Glucose: 84 mg/dL (ref 70–99)
Potassium: 4.5 mmol/L (ref 3.5–5.2)
Sodium: 140 mmol/L (ref 134–144)
Total Protein: 7 g/dL (ref 6.0–8.5)
eGFR: 109 mL/min/{1.73_m2} (ref >59)

## 2020-12-08 LAB — CBC
Hematocrit: 36.3 % (ref 34.0–46.6)
Hemoglobin: 12.4 g/dL (ref 11.1–15.9)
MCH: 30.5 pg (ref 26.6–33.0)
MCHC: 34.2 g/dL (ref 31.5–35.7)
MCV: 89 fL (ref 79–97)
Platelets: 369 10*3/uL (ref 150–450)
RBC: 4.06 x10E6/uL (ref 3.77–5.28)
RDW: 12.6 % (ref 11.7–15.4)
WBC: 3.9 10*3/uL (ref 3.4–10.8)

## 2020-12-08 LAB — LIPID PANEL
Chol/HDL Ratio: 2.4 ratio (ref 0.0–4.4)
Cholesterol, Total: 172 mg/dL (ref 100–199)
HDL: 73 mg/dL (ref >39)
LDL Chol Calc (NIH): 79 mg/dL (ref 0–99)
Triglycerides: 117 mg/dL (ref 0–149)
VLDL Cholesterol Cal: 20 mg/dL (ref 5–40)

## 2020-12-08 LAB — HCV AB W REFLEX TO QUANT PCR: HCV Ab: <0.1 s/co ratio (ref 0.0–0.9)

## 2020-12-08 LAB — HCV INTERPRETATION

## 2020-12-22 ENCOUNTER — Other Ambulatory Visit (INDEPENDENT_AMBULATORY_CARE_PROVIDER_SITE_OTHER): Payer: Self-pay | Admitting: Primary Care

## 2020-12-22 ENCOUNTER — Other Ambulatory Visit (HOSPITAL_COMMUNITY): Payer: Self-pay

## 2020-12-22 MED ORDER — CIPROFLOXACIN HCL 0.3 % OP SOLN
2.0000 [drp] | OPHTHALMIC | 0 refills | Status: DC
  Filled 2020-12-22: qty 5, 7d supply, fill #0

## 2021-01-19 ENCOUNTER — Encounter: Payer: No Typology Code available for payment source | Admitting: Nurse Practitioner

## 2021-01-22 ENCOUNTER — Other Ambulatory Visit (INDEPENDENT_AMBULATORY_CARE_PROVIDER_SITE_OTHER): Payer: Self-pay | Admitting: Primary Care

## 2021-01-22 ENCOUNTER — Other Ambulatory Visit (HOSPITAL_COMMUNITY): Payer: Self-pay

## 2021-01-22 DIAGNOSIS — N390 Urinary tract infection, site not specified: Secondary | ICD-10-CM

## 2021-01-22 MED ORDER — NITROFURANTOIN MONOHYD MACRO 100 MG PO CAPS
100.0000 mg | ORAL_CAPSULE | Freq: Two times a day (BID) | ORAL | 0 refills | Status: DC
  Filled 2021-01-22: qty 10, 5d supply, fill #0

## 2021-03-07 ENCOUNTER — Other Ambulatory Visit (HOSPITAL_COMMUNITY): Payer: Self-pay

## 2021-03-16 ENCOUNTER — Other Ambulatory Visit (HOSPITAL_COMMUNITY): Payer: Self-pay

## 2021-03-16 ENCOUNTER — Other Ambulatory Visit: Payer: Self-pay | Admitting: Nurse Practitioner

## 2021-03-16 MED ORDER — PHENTERMINE HCL 37.5 MG PO TABS
37.5000 mg | ORAL_TABLET | Freq: Every morning | ORAL | 1 refills | Status: DC
  Filled 2021-03-16 – 2021-04-24 (×3): qty 30, 30d supply, fill #0

## 2021-03-16 NOTE — Telephone Encounter (Signed)
Requested medication (s) are due for refill today:   Provider to review  Requested medication (s) are on the active medication list:   Yes  Future visit scheduled:   No  Had CPE 3 mo. ago   Last ordered: 09/29/2020 #30, 1 refill  Returned because it's a non delegated refill   Requested Prescriptions  Pending Prescriptions Disp Refills   phentermine (ADIPEX-P) 37.5 MG tablet 30 tablet 1    Sig: Take 1 tablet (37.5 mg total) by mouth in the morning.     Not Delegated - Gastroenterology:  Antiobesity Agents Failed - 03/16/2021  8:56 AM      Failed - This refill cannot be delegated      Passed - Last BP in normal range    BP Readings from Last 1 Encounters:  12/01/20 113/82          Passed - Last Heart Rate in normal range    Pulse Readings from Last 1 Encounters:  12/01/20 (!) 101          Passed - Valid encounter within last 12 months    Recent Outpatient Visits           3 months ago Encounter for annual physical exam   [place], [place] W, NP   2 years ago Urinary frequency   [place], Michelle P, NP   2 years ago Insect bite of right thigh, initial encounter   [place], [place], NP   2 years ago Vaginal discharge   [place], [place], NP   3 years ago Encounter for annual physical exam   [place] Gildardo Pounds, NP

## 2021-03-19 ENCOUNTER — Other Ambulatory Visit (HOSPITAL_COMMUNITY): Payer: Self-pay

## 2021-04-16 ENCOUNTER — Other Ambulatory Visit (HOSPITAL_COMMUNITY): Payer: Self-pay

## 2021-04-24 ENCOUNTER — Other Ambulatory Visit (HOSPITAL_COMMUNITY): Payer: Self-pay

## 2021-05-18 ENCOUNTER — Encounter: Payer: Self-pay | Admitting: Nurse Practitioner

## 2021-05-18 ENCOUNTER — Ambulatory Visit: Payer: No Typology Code available for payment source | Attending: Nurse Practitioner | Admitting: Nurse Practitioner

## 2021-05-18 ENCOUNTER — Other Ambulatory Visit (HOSPITAL_COMMUNITY): Payer: Self-pay

## 2021-05-18 VITALS — BP 108/76 | HR 96 | Ht 64.0 in | Wt 192.6 lb

## 2021-05-18 DIAGNOSIS — Z6833 Body mass index (BMI) 33.0-33.9, adult: Secondary | ICD-10-CM | POA: Diagnosis not present

## 2021-05-18 DIAGNOSIS — H539 Unspecified visual disturbance: Secondary | ICD-10-CM | POA: Diagnosis not present

## 2021-05-18 DIAGNOSIS — E6609 Other obesity due to excess calories: Secondary | ICD-10-CM

## 2021-05-18 MED ORDER — PHENTERMINE HCL 37.5 MG PO TABS
37.5000 mg | ORAL_TABLET | Freq: Every morning | ORAL | 0 refills | Status: DC
  Filled 2021-05-18: qty 90, 90d supply, fill #0
  Filled 2021-07-02: qty 30, 30d supply, fill #0
  Filled 2021-08-09 – 2021-09-11 (×2): qty 30, 30d supply, fill #1
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
## 2021-05-30 ENCOUNTER — Other Ambulatory Visit (HOSPITAL_COMMUNITY)
Admission: RE | Admit: 2021-05-30 | Discharge: 2021-05-30 | Disposition: A | Discharge status: Home / Self Care | Payer: No Typology Code available for payment source | Source: Ambulatory Visit | Attending: Primary Care | Admitting: Primary Care

## 2021-05-30 ENCOUNTER — Other Ambulatory Visit (INDEPENDENT_AMBULATORY_CARE_PROVIDER_SITE_OTHER): Payer: Self-pay | Admitting: Primary Care

## 2021-05-30 DIAGNOSIS — N898 Other specified noninflammatory disorders of vagina: Secondary | ICD-10-CM | POA: Diagnosis present

## 2021-05-31 LAB — CERVICOVAGINAL ANCILLARY ONLY
Bacterial Vaginitis (gardnerella): NEGATIVE
Candida Glabrata: NEGATIVE
Candida Vaginitis: NEGATIVE
Chlamydia: NEGATIVE
Comment: NEGATIVE
Comment: NEGATIVE
Comment: NEGATIVE
Comment: NEGATIVE
Comment: NEGATIVE
Comment: NORMAL
Neisseria Gonorrhea: NEGATIVE
Trichomonas: NEGATIVE

## 2021-07-02 ENCOUNTER — Other Ambulatory Visit (INDEPENDENT_AMBULATORY_CARE_PROVIDER_SITE_OTHER): Payer: No Typology Code available for payment source | Admitting: Obstetrics and Gynecology

## 2021-07-02 ENCOUNTER — Encounter: Payer: Self-pay | Admitting: Obstetrics and Gynecology

## 2021-07-02 ENCOUNTER — Other Ambulatory Visit (HOSPITAL_COMMUNITY): Payer: Self-pay

## 2021-07-02 DIAGNOSIS — Z3040 Encounter for surveillance of contraceptives, unspecified: Secondary | ICD-10-CM

## 2021-07-02 MED ORDER — NORGESTIMATE-ETH ESTRADIOL 0.25-35 MG-MCG PO TABS
1.0000 | ORAL_TABLET | Freq: Every day | ORAL | 11 refills | Status: DC
  Filled 2021-07-02: qty 84, 84d supply, fill #0
  Filled 2021-09-11: qty 84, 84d supply, fill #1
  Filled 2021-12-12: qty 84, 84d supply, fill #2
  Filled 2022-02-27 – 2022-03-13 (×2): qty 84, 84d supply, fill #3

## 2021-07-02 NOTE — Progress Notes (Signed)
Rx sent to [facility] per request of patient. She reports Xulane patch is no longer adhering to her skin. She wants to switch to OCP. Notified of change via [place]. ? ?Laury Deep, CNM  ?07/02/2021 1:54 PM  ?

## 2021-07-17 ENCOUNTER — Encounter: Payer: Self-pay | Admitting: Obstetrics and Gynecology

## 2021-08-09 ENCOUNTER — Other Ambulatory Visit (HOSPITAL_COMMUNITY): Payer: Self-pay

## 2021-08-22 ENCOUNTER — Other Ambulatory Visit (HOSPITAL_COMMUNITY): Payer: Self-pay

## 2021-09-11 ENCOUNTER — Other Ambulatory Visit (HOSPITAL_COMMUNITY): Payer: Self-pay

## 2021-11-08 ENCOUNTER — Other Ambulatory Visit (INDEPENDENT_AMBULATORY_CARE_PROVIDER_SITE_OTHER): Payer: Self-pay | Admitting: Primary Care

## 2021-11-08 ENCOUNTER — Other Ambulatory Visit (HOSPITAL_COMMUNITY): Payer: Self-pay

## 2021-11-08 MED FILL — Valacyclovir HCl Tab 1 GM: ORAL | 8 days supply | Qty: 11 | Fill #0 | Status: AC

## 2021-11-08 NOTE — Telephone Encounter (Signed)
Will forward to provider  

## 2021-11-09 ENCOUNTER — Other Ambulatory Visit (HOSPITAL_COMMUNITY): Payer: Self-pay

## 2021-11-13 ENCOUNTER — Other Ambulatory Visit (HOSPITAL_COMMUNITY): Payer: Self-pay

## 2021-11-16 ENCOUNTER — Other Ambulatory Visit (HOSPITAL_COMMUNITY): Payer: Self-pay

## 2021-11-16 ENCOUNTER — Other Ambulatory Visit: Payer: Self-pay | Admitting: Nurse Practitioner

## 2021-11-16 DIAGNOSIS — E6609 Other obesity due to excess calories: Secondary | ICD-10-CM

## 2021-11-16 MED ORDER — PHENTERMINE HCL 37.5 MG PO TABS
37.5000 mg | ORAL_TABLET | Freq: Every morning | ORAL | 0 refills | Status: DC
  Filled 2021-11-16 – 2021-12-12 (×2): qty 90, 90d supply, fill #0

## 2021-11-26 ENCOUNTER — Other Ambulatory Visit (HOSPITAL_COMMUNITY): Payer: Self-pay

## 2021-12-12 ENCOUNTER — Other Ambulatory Visit (HOSPITAL_COMMUNITY): Payer: Self-pay

## 2021-12-13 ENCOUNTER — Other Ambulatory Visit (HOSPITAL_COMMUNITY): Payer: Self-pay

## 2021-12-17 ENCOUNTER — Other Ambulatory Visit (HOSPITAL_COMMUNITY): Payer: Self-pay

## 2022-02-27 ENCOUNTER — Other Ambulatory Visit (HOSPITAL_COMMUNITY): Payer: Self-pay

## 2022-03-11 ENCOUNTER — Other Ambulatory Visit (HOSPITAL_COMMUNITY): Payer: Self-pay

## 2022-03-13 ENCOUNTER — Other Ambulatory Visit (HOSPITAL_COMMUNITY): Payer: Self-pay

## 2022-03-19 DIAGNOSIS — F4312 Post-traumatic stress disorder, chronic: Secondary | ICD-10-CM | POA: Diagnosis not present

## 2022-04-05 DIAGNOSIS — F4312 Post-traumatic stress disorder, chronic: Secondary | ICD-10-CM | POA: Diagnosis not present

## 2022-05-15 DIAGNOSIS — F4312 Post-traumatic stress disorder, chronic: Secondary | ICD-10-CM | POA: Diagnosis not present

## 2022-05-16 ENCOUNTER — Other Ambulatory Visit (HOSPITAL_COMMUNITY): Payer: Self-pay

## 2022-05-16 MED ORDER — SODIUM FLUORIDE 1.1 % DT PSTE
PASTE | Freq: Three times a day (TID) | DENTAL | 3 refills | Status: AC
  Filled 2022-05-16: qty 100, 30d supply, fill #0
  Filled 2022-06-26: qty 100, 15d supply, fill #0
  Filled 2022-09-09 – 2022-09-25 (×3): qty 100, 15d supply, fill #1
  Filled 2023-03-08 – 2023-04-16 (×3): qty 100, 15d supply, fill #2

## 2022-05-31 ENCOUNTER — Other Ambulatory Visit (HOSPITAL_COMMUNITY): Payer: Self-pay

## 2022-05-31 ENCOUNTER — Other Ambulatory Visit: Payer: Self-pay | Admitting: Obstetrics and Gynecology

## 2022-05-31 ENCOUNTER — Other Ambulatory Visit: Payer: Self-pay | Admitting: Nurse Practitioner

## 2022-05-31 DIAGNOSIS — E6609 Other obesity due to excess calories: Secondary | ICD-10-CM

## 2022-05-31 DIAGNOSIS — Z3040 Encounter for surveillance of contraceptives, unspecified: Secondary | ICD-10-CM

## 2022-06-01 MED ORDER — PHENTERMINE HCL 37.5 MG PO TABS
37.5000 mg | ORAL_TABLET | Freq: Every morning | ORAL | 0 refills | Status: DC
  Filled 2022-06-01: qty 90, 90d supply, fill #0
  Filled 2022-06-26: qty 30, 30d supply, fill #0
  Filled 2022-09-24: qty 30, 30d supply, fill #1

## 2022-06-03 ENCOUNTER — Other Ambulatory Visit (HOSPITAL_COMMUNITY): Payer: Self-pay

## 2022-06-03 ENCOUNTER — Other Ambulatory Visit: Payer: Self-pay

## 2022-06-05 MED ORDER — NORGESTIMATE-ETH ESTRADIOL 0.25-35 MG-MCG PO TABS
1.0000 | ORAL_TABLET | Freq: Every day | ORAL | 11 refills | Status: DC
  Filled 2022-06-05: qty 28, 28d supply, fill #0
  Filled 2022-06-26: qty 84, 84d supply, fill #0

## 2022-06-06 ENCOUNTER — Other Ambulatory Visit: Payer: Self-pay

## 2022-06-06 ENCOUNTER — Other Ambulatory Visit (HOSPITAL_COMMUNITY): Payer: Self-pay

## 2022-06-13 ENCOUNTER — Other Ambulatory Visit (HOSPITAL_COMMUNITY): Payer: Self-pay

## 2022-06-17 ENCOUNTER — Other Ambulatory Visit (HOSPITAL_COMMUNITY): Payer: Self-pay

## 2022-06-26 ENCOUNTER — Other Ambulatory Visit (HOSPITAL_COMMUNITY): Payer: Self-pay

## 2022-06-26 MED FILL — Valacyclovir HCl Tab 1 GM: ORAL | 8 days supply | Qty: 11 | Fill #1 | Status: AC

## 2022-07-12 DIAGNOSIS — H5213 Myopia, bilateral: Secondary | ICD-10-CM | POA: Diagnosis not present

## 2022-07-17 DIAGNOSIS — F4312 Post-traumatic stress disorder, chronic: Secondary | ICD-10-CM | POA: Diagnosis not present

## 2022-08-02 DIAGNOSIS — Z124 Encounter for screening for malignant neoplasm of cervix: Secondary | ICD-10-CM | POA: Diagnosis not present

## 2022-08-02 DIAGNOSIS — Z01419 Encounter for gynecological examination (general) (routine) without abnormal findings: Secondary | ICD-10-CM | POA: Diagnosis not present

## 2022-08-02 DIAGNOSIS — Z113 Encounter for screening for infections with a predominantly sexual mode of transmission: Secondary | ICD-10-CM | POA: Diagnosis not present

## 2022-08-05 DIAGNOSIS — F4312 Post-traumatic stress disorder, chronic: Secondary | ICD-10-CM | POA: Diagnosis not present

## 2022-08-30 DIAGNOSIS — F4312 Post-traumatic stress disorder, chronic: Secondary | ICD-10-CM | POA: Diagnosis not present

## 2022-09-02 ENCOUNTER — Encounter (HOSPITAL_BASED_OUTPATIENT_CLINIC_OR_DEPARTMENT_OTHER): Payer: Self-pay | Admitting: Obstetrics and Gynecology

## 2022-09-02 ENCOUNTER — Other Ambulatory Visit: Payer: Self-pay

## 2022-09-02 NOTE — Progress Notes (Addendum)
Spoke w/ via phone for pre-op interview---pt Lab needs dos---- urine preg, surgery orders req dr Claiborne Billings epic ib              Lab results------none COVID test -----patient states asymptomatic no test needed Arrive at -------1000 am 09-24-2022 NPO after MN NO Solid Food.  Clear liquids from MN until---900 am Med rec completed Medications to take morning of surgery -----birth control pill Diabetic medication -----n/a Patient instructed no nail polish to be worn day of surgery Patient instructed to bring photo id and insurance card day of surgery Patient aware to have Driver (ride ) / caregiver mother or boyfriend   for 24 hours after surgery  Patient Special Instructions -----stop phentermine by 09-06-2022 Pre-Op special Instructions -----none Patient verbalized understanding of instructions that were given at this phone interview. Patient denies shortness of breath, chest pain, fever, cough at this phone interview.  Pt has right ear piercing cannot remove

## 2022-09-09 MED FILL — Valacyclovir HCl Tab 1 GM: ORAL | 8 days supply | Qty: 11 | Fill #2 | Status: CN

## 2022-09-11 ENCOUNTER — Other Ambulatory Visit (HOSPITAL_COMMUNITY): Payer: Self-pay

## 2022-09-11 DIAGNOSIS — F331 Major depressive disorder, recurrent, moderate: Secondary | ICD-10-CM | POA: Diagnosis not present

## 2022-09-11 DIAGNOSIS — F413 Other mixed anxiety disorders: Secondary | ICD-10-CM | POA: Diagnosis not present

## 2022-09-11 DIAGNOSIS — F4312 Post-traumatic stress disorder, chronic: Secondary | ICD-10-CM | POA: Diagnosis not present

## 2022-09-11 MED ORDER — PRAZOSIN HCL 1 MG PO CAPS
1.0000 mg | ORAL_CAPSULE | Freq: Every day | ORAL | 0 refills | Status: DC
  Filled 2022-09-11 – 2022-09-24 (×2): qty 30, 30d supply, fill #0

## 2022-09-11 MED ORDER — TRAZODONE HCL 50 MG PO TABS
50.0000 mg | ORAL_TABLET | Freq: Every day | ORAL | 0 refills | Status: DC
  Filled 2022-09-11 – 2022-09-25 (×2): qty 90, 90d supply, fill #0

## 2022-09-20 ENCOUNTER — Other Ambulatory Visit (HOSPITAL_COMMUNITY): Payer: Self-pay

## 2022-09-23 ENCOUNTER — Other Ambulatory Visit (HOSPITAL_COMMUNITY): Payer: Self-pay

## 2022-09-24 ENCOUNTER — Ambulatory Visit (HOSPITAL_BASED_OUTPATIENT_CLINIC_OR_DEPARTMENT_OTHER): Payer: 59 | Admitting: Anesthesiology

## 2022-09-24 ENCOUNTER — Ambulatory Visit (HOSPITAL_BASED_OUTPATIENT_CLINIC_OR_DEPARTMENT_OTHER)
Admission: RE | Admit: 2022-09-24 | Discharge: 2022-09-24 | Disposition: A | Discharge status: Home / Self Care | Payer: 59 | Source: Home / Self Care | Attending: Obstetrics and Gynecology | Admitting: Obstetrics and Gynecology

## 2022-09-24 ENCOUNTER — Other Ambulatory Visit: Payer: Self-pay

## 2022-09-24 ENCOUNTER — Encounter (HOSPITAL_BASED_OUTPATIENT_CLINIC_OR_DEPARTMENT_OTHER): Payer: Self-pay | Admitting: Obstetrics and Gynecology

## 2022-09-24 ENCOUNTER — Ambulatory Visit (HOSPITAL_BASED_OUTPATIENT_CLINIC_OR_DEPARTMENT_OTHER): Payer: Self-pay | Admitting: Anesthesiology

## 2022-09-24 ENCOUNTER — Encounter (HOSPITAL_BASED_OUTPATIENT_CLINIC_OR_DEPARTMENT_OTHER)
Admission: RE | Disposition: A | Discharge status: Home / Self Care | Payer: Self-pay | Source: Home / Self Care | Attending: Obstetrics and Gynecology

## 2022-09-24 ENCOUNTER — Other Ambulatory Visit (HOSPITAL_COMMUNITY): Payer: Self-pay

## 2022-09-24 DIAGNOSIS — Z87891 Personal history of nicotine dependence: Secondary | ICD-10-CM | POA: Insufficient documentation

## 2022-09-24 DIAGNOSIS — J45909 Unspecified asthma, uncomplicated: Secondary | ICD-10-CM | POA: Diagnosis not present

## 2022-09-24 DIAGNOSIS — Z302 Encounter for sterilization: Secondary | ICD-10-CM | POA: Insufficient documentation

## 2022-09-24 DIAGNOSIS — Z6839 Body mass index (BMI) 39.0-39.9, adult: Secondary | ICD-10-CM | POA: Insufficient documentation

## 2022-09-24 DIAGNOSIS — Z01818 Encounter for other preprocedural examination: Secondary | ICD-10-CM

## 2022-09-24 HISTORY — PX: LAPAROSCOPIC BILATERAL SALPINGECTOMY: SHX5889

## 2022-09-24 LAB — CBC
HCT: 37.2 % (ref 36.0–46.0)
Hemoglobin: 12.4 g/dL (ref 12.0–15.0)
MCH: 29.8 pg (ref 26.0–34.0)
MCHC: 33.3 g/dL (ref 30.0–36.0)
MCV: 89.4 fL (ref 80.0–100.0)
Platelets: 289 10*3/uL (ref 150–400)
RBC: 4.16 MIL/uL (ref 3.87–5.11)
RDW: 11.9 % (ref 11.5–15.5)
WBC: 5.6 10*3/uL (ref 4.0–10.5)
nRBC: 0 % (ref 0.0–0.2)

## 2022-09-24 LAB — POCT PREGNANCY, URINE: Preg Test, Ur: NEGATIVE

## 2022-09-24 LAB — TYPE AND SCREEN
ABO/RH(D): A POS
Antibody Screen: NEGATIVE

## 2022-09-24 SURGERY — SALPINGECTOMY, BILATERAL, LAPAROSCOPIC
Anesthesia: General | Site: Abdomen | Laterality: Bilateral

## 2022-09-24 MED ORDER — POVIDONE-IODINE 10 % EX SWAB: 2.0000 | Freq: Once | CUTANEOUS | Status: DC

## 2022-09-24 MED ORDER — PROPOFOL 10 MG/ML IV BOLUS
INTRAVENOUS | Status: AC
  Filled 2022-09-24: qty 20

## 2022-09-24 MED ORDER — FENTANYL CITRATE (PF) 100 MCG/2ML IJ SOLN
INTRAMUSCULAR | Status: DC | PRN
  Administered 2022-09-24 (×4): 50 ug via INTRAVENOUS

## 2022-09-24 MED ORDER — MIDAZOLAM HCL 2 MG/2ML IJ SOLN
INTRAMUSCULAR | Status: DC | PRN
  Administered 2022-09-24: 2 mg via INTRAVENOUS

## 2022-09-24 MED ORDER — DEXMEDETOMIDINE HCL IN NACL 80 MCG/20ML IV SOLN
INTRAVENOUS | Status: DC | PRN
  Administered 2022-09-24: 12 ug via INTRAVENOUS

## 2022-09-24 MED ORDER — MIDAZOLAM HCL 2 MG/2ML IJ SOLN
INTRAMUSCULAR | Status: AC
  Filled 2022-09-24: qty 2

## 2022-09-24 MED ORDER — KETOROLAC TROMETHAMINE 30 MG/ML IJ SOLN
INTRAMUSCULAR | Status: DC | PRN
  Administered 2022-09-24: 30 mg via INTRAVENOUS

## 2022-09-24 MED ORDER — SUGAMMADEX SODIUM 200 MG/2ML IV SOLN
INTRAVENOUS | Status: DC | PRN
  Administered 2022-09-24: 225 mg via INTRAVENOUS

## 2022-09-24 MED ORDER — 0.9 % SODIUM CHLORIDE (POUR BTL) OPTIME
TOPICAL | Status: DC | PRN
  Administered 2022-09-24: 500 mL

## 2022-09-24 MED ORDER — SCOPOLAMINE 1 MG/3DAYS TD PT72
MEDICATED_PATCH | TRANSDERMAL | Status: AC
  Filled 2022-09-24: qty 1

## 2022-09-24 MED ORDER — DEXAMETHASONE SODIUM PHOSPHATE 10 MG/ML IJ SOLN
INTRAMUSCULAR | Status: DC | PRN
  Administered 2022-09-24: 10 mg via INTRAVENOUS

## 2022-09-24 MED ORDER — OXYCODONE HCL 5 MG PO TABS
5.0000 mg | ORAL_TABLET | ORAL | 0 refills | Status: DC | PRN
Start: 2022-09-24 — End: 2022-10-23
  Filled 2022-09-24: qty 9, 2d supply, fill #0
  Filled 2022-09-24: qty 6, 1d supply, fill #0

## 2022-09-24 MED ORDER — LIDOCAINE 2% (20 MG/ML) 5 ML SYRINGE
INTRAMUSCULAR | Status: DC | PRN
  Administered 2022-09-24: 60 mg via INTRAVENOUS

## 2022-09-24 MED ORDER — FENTANYL CITRATE (PF) 100 MCG/2ML IJ SOLN
INTRAMUSCULAR | Status: AC
  Filled 2022-09-24: qty 2

## 2022-09-24 MED ORDER — ONDANSETRON HCL 4 MG/2ML IJ SOLN
INTRAMUSCULAR | Status: DC | PRN
  Administered 2022-09-24: 4 mg via INTRAVENOUS

## 2022-09-24 MED ORDER — FENTANYL CITRATE (PF) 100 MCG/2ML IJ SOLN
25.0000 ug | INTRAMUSCULAR | Status: DC | PRN
  Administered 2022-09-24: 50 ug via INTRAVENOUS
  Administered 2022-09-24: 25 ug via INTRAVENOUS
  Administered 2022-09-24: 50 ug via INTRAVENOUS
  Administered 2022-09-24: 25 ug via INTRAVENOUS

## 2022-09-24 MED ORDER — ROCURONIUM BROMIDE 10 MG/ML (PF) SYRINGE
PREFILLED_SYRINGE | INTRAVENOUS | Status: AC
  Filled 2022-09-24: qty 10

## 2022-09-24 MED ORDER — ONDANSETRON HCL 4 MG/2ML IJ SOLN
INTRAMUSCULAR | Status: AC
  Filled 2022-09-24: qty 2

## 2022-09-24 MED ORDER — DEXAMETHASONE SODIUM PHOSPHATE 10 MG/ML IJ SOLN
INTRAMUSCULAR | Status: AC
  Filled 2022-09-24: qty 1

## 2022-09-24 MED ORDER — LIDOCAINE HCL (PF) 2 % IJ SOLN
INTRAMUSCULAR | Status: AC
  Filled 2022-09-24: qty 5

## 2022-09-24 MED ORDER — KETOROLAC TROMETHAMINE 30 MG/ML IJ SOLN
INTRAMUSCULAR | Status: AC
  Filled 2022-09-24: qty 1

## 2022-09-24 MED ORDER — LACTATED RINGERS IV SOLN: INTRAVENOUS | Status: DC

## 2022-09-24 MED ORDER — ONDANSETRON HCL 4 MG/2ML IJ SOLN: 4.0000 mg | Freq: Once | INTRAMUSCULAR | Status: DC | PRN

## 2022-09-24 MED ORDER — ACETAMINOPHEN 500 MG PO TABS
ORAL_TABLET | ORAL | Status: AC
  Filled 2022-09-24: qty 2

## 2022-09-24 MED ORDER — OXYCODONE HCL 5 MG PO TABS: 5.0000 mg | ORAL_TABLET | ORAL | Status: DC | PRN

## 2022-09-24 MED ORDER — AMISULPRIDE (ANTIEMETIC) 5 MG/2ML IV SOLN: 10.0000 mg | Freq: Once | INTRAVENOUS | Status: DC | PRN

## 2022-09-24 MED ORDER — SCOPOLAMINE 1 MG/3DAYS TD PT72
1.0000 | MEDICATED_PATCH | Freq: Once | TRANSDERMAL | Status: DC
  Administered 2022-09-24: 1.5 mg via TRANSDERMAL

## 2022-09-24 MED ORDER — BUPIVACAINE HCL (PF) 0.5 % IJ SOLN
INTRAMUSCULAR | Status: DC | PRN
  Administered 2022-09-24: 6 mL

## 2022-09-24 MED ORDER — PROPOFOL 10 MG/ML IV BOLUS
INTRAVENOUS | Status: DC | PRN
Start: 2022-09-24 — End: 2022-09-24
  Administered 2022-09-24: 170 mg via INTRAVENOUS

## 2022-09-24 MED ORDER — ACETAMINOPHEN 500 MG PO TABS
1000.0000 mg | ORAL_TABLET | Freq: Once | ORAL | Status: AC
  Administered 2022-09-24: 1000 mg via ORAL

## 2022-09-24 MED ORDER — ROCURONIUM BROMIDE 10 MG/ML (PF) SYRINGE
PREFILLED_SYRINGE | INTRAVENOUS | Status: DC | PRN
  Administered 2022-09-24: 60 mg via INTRAVENOUS

## 2022-09-24 MED FILL — Valacyclovir HCl Tab 1 GM: ORAL | 8 days supply | Qty: 11 | Fill #2 | Status: AC

## 2022-09-24 SURGICAL SUPPLY — 41 items
ADH SKN CLS APL DERMABOND .7 (GAUZE/BANDAGES/DRESSINGS) ×1
CABLE HIGH FREQUENCY MONO STRZ (ELECTRODE) IMPLANT
CLIP FILSHIE TUBAL LIGA STRL (Clip) IMPLANT
DERMABOND ADVANCED .7 DNX12 (GAUZE/BANDAGES/DRESSINGS) ×2 IMPLANT
DEVICE TROCAR PUNCTURE CLOSURE (ENDOMECHANICALS) IMPLANT
DRAPE SURG IRRIG POUCH 19X23 (DRAPES) ×2 IMPLANT
DRSG OPSITE POSTOP 3X4 (GAUZE/BANDAGES/DRESSINGS) IMPLANT
DURAPREP 26ML APPLICATOR (WOUND CARE) ×2 IMPLANT
GAUZE 4X4 16PLY ~~LOC~~+RFID DBL (SPONGE) ×4 IMPLANT
GLOVE BIOGEL PI IND STRL 6.5 (GLOVE) ×4 IMPLANT
GLOVE BIOGEL PI IND STRL 7.0 (GLOVE) ×2 IMPLANT
GLOVE ECLIPSE 6.5 STRL STRAW (GLOVE) ×2 IMPLANT
GOWN STRL REUS W/TWL LRG LVL3 (GOWN DISPOSABLE) ×4 IMPLANT
IRRIG SUCT STRYKERFLOW 2 WTIP (MISCELLANEOUS)
IRRIGATION SUCT STRKRFLW 2 WTP (MISCELLANEOUS) ×2 IMPLANT
KIT PINK PAD W/HEAD ARE REST (MISCELLANEOUS) ×1
KIT PINK PAD W/HEAD ARM REST (MISCELLANEOUS) ×2 IMPLANT
KIT TURNOVER CYSTO (KITS) ×2 IMPLANT
LIGASURE VESSEL 5MM BLUNT TIP (ELECTROSURGICAL) IMPLANT
NDL INSUFFLATION 14GA 120MM (NEEDLE) ×2 IMPLANT
NEEDLE INSUFFLATION 14GA 120MM (NEEDLE) ×1
NS IRRIG 1000ML POUR BTL (IV SOLUTION) ×2 IMPLANT
NS IRRIG 500ML POUR BTL (IV SOLUTION) IMPLANT
PACK LAPAROSCOPY BASIN (CUSTOM PROCEDURE TRAY) ×2 IMPLANT
PROTECTOR NERVE ULNAR (MISCELLANEOUS) ×4 IMPLANT
SET TUBE SMOKE EVAC HIGH FLOW (TUBING) ×2 IMPLANT
SLEEVE SCD COMPRESS KNEE MED (STOCKING) ×2 IMPLANT
SLEEVE Z-THREAD 5X100MM (TROCAR) ×2 IMPLANT
SOL ELECTROSURG ANTI STICK (MISCELLANEOUS)
SOLUTION ELECTROSURG ANTI STCK (MISCELLANEOUS) IMPLANT
SUT VIC AB 1 CT1 36 (SUTURE) IMPLANT
SUT VICRYL 0 UR6 27IN ABS (SUTURE) ×2 IMPLANT
SUT VICRYL RAPIDE 3 0 (SUTURE) ×2 IMPLANT
SYS BAG RETRIEVAL 10MM (BASKET)
SYSTEM BAG RETRIEVAL 10MM (BASKET) IMPLANT
TOWEL OR 17X24 6PK STRL BLUE (TOWEL DISPOSABLE) ×4 IMPLANT
TRAY FOLEY W/BAG SLVR 14FR LF (SET/KITS/TRAYS/PACK) ×2 IMPLANT
TROCAR BALLN 12MMX100 BLUNT (TROCAR) IMPLANT
TROCAR Z-THREAD FIOS 11X100 BL (TROCAR) ×2 IMPLANT
TROCAR Z-THREAD FIOS 5X100MM (TROCAR) ×2 IMPLANT
WARMER LAPAROSCOPE (MISCELLANEOUS) ×2 IMPLANT

## 2022-09-24 NOTE — Transfer of Care (Signed)
Immediate Anesthesia Transfer of Care Note  Patient: Karen Jordan  Procedure(s) Performed: Procedure(s) (LRB): LAPAROSCOPIC BILATERAL SALPINGECTOMY (Bilateral)  Patient Location: PACU  Anesthesia Type: General  Level of Consciousness: awake, oriented, sedated and patient cooperative  Airway & Oxygen Therapy: Patient Spontanous Breathing and Patient connected to face mask oxygen  Post-op Assessment: Report given to PACU RN and Post -op Vital signs reviewed and stable  Post vital signs: Reviewed and stable  Complications: No apparent anesthesia complications Last Vitals:  Vitals Value Taken Time  BP 121/78 09/24/22 1323  Temp 36.9 C 09/24/22 1324  Pulse 77 09/24/22 1330  Resp 13 09/24/22 1330  SpO2 97 % 09/24/22 1330  Vitals shown include unfiled device data.  Last Pain:  Vitals:   09/24/22 1036  TempSrc: Oral  PainSc: 0-No pain      Patients Stated Pain Goal: 5 (09/24/22 1036)  Complications: No notable events documented.

## 2022-09-24 NOTE — Anesthesia Procedure Notes (Signed)
Procedure Name: Intubation Date/Time: 09/24/2022 12:16 PM  Performed by: Francie Massing, CRNAPre-anesthesia Checklist: Patient identified, Emergency Drugs available, Suction available and Patient being monitored Patient Re-evaluated:Patient Re-evaluated prior to induction Oxygen Delivery Method: Circle system utilized Preoxygenation: Pre-oxygenation with 100% oxygen Induction Type: IV induction Ventilation: Mask ventilation without difficulty Laryngoscope Size: Mac and 4 Grade View: Grade I Tube type: Oral Tube size: 7.0 mm Number of attempts: 1 Airway Equipment and Method: Stylet and Oral airway Placement Confirmation: ETT inserted through vocal cords under direct vision, positive ETCO2 and breath sounds checked- equal and bilateral Secured at: 23 cm Tube secured with: Tape Dental Injury: Teeth and Oropharynx as per pre-operative assessment

## 2022-09-24 NOTE — Anesthesia Preprocedure Evaluation (Addendum)
Anesthesia Evaluation  Patient identified by MRN, date of birth, ID band Patient awake    Reviewed: Allergy & Precautions, NPO status , Patient's Chart, lab work & pertinent test results  Airway Mallampati: III  TM Distance: >3 FB Neck ROM: Full    Dental  (+) Teeth Intact, Dental Advisory Given   Pulmonary asthma , former smoker   Pulmonary exam normal breath sounds clear to auscultation       Cardiovascular negative cardio ROS Normal cardiovascular exam Rhythm:Regular Rate:Normal     Neuro/Psych  PSYCHIATRIC DISORDERS Anxiety Depression    negative neurological ROS     GI/Hepatic negative GI ROS, Neg liver ROS,,,  Endo/Other    Morbid obesity  Renal/GU negative Renal ROS     Musculoskeletal negative musculoskeletal ROS (+)    Abdominal   Peds  Hematology negative hematology ROS (+)   Anesthesia Other Findings Day of surgery medications reviewed with the patient.  Reproductive/Obstetrics Desires sterilization                              Anesthesia Physical Anesthesia Plan  ASA: 3  Anesthesia Plan: General   Post-op Pain Management: Tylenol PO (pre-op)* and Toradol IV (intra-op)*   Induction: Intravenous  PONV Risk Score and Plan: 4 or greater and Scopolamine patch - Pre-op, Midazolam, Dexamethasone and Ondansetron  Airway Management Planned: Oral ETT  Additional Equipment:   Intra-op Plan:   Post-operative Plan: Extubation in OR  Informed Consent: I have reviewed the patients History and Physical, chart, labs and discussed the procedure including the risks, benefits and alternatives for the proposed anesthesia with the patient or authorized representative who has indicated his/her understanding and acceptance.     Dental advisory given  Plan Discussed with: CRNA  Anesthesia Plan Comments:        Anesthesia Quick Evaluation
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
## 2022-09-24 NOTE — Op Note (Signed)
09/24/2022  1:12 PM  PATIENT:  Karen Jordan  37 y.o. female  PRE-OPERATIVE DIAGNOSIS:  desired permanent sterilization  POST-OPERATIVE DIAGNOSIS:  desired permancent sterilization  PROCEDURE:  Procedure(s): LAPAROSCOPIC BILATERAL SALPINGECTOMY (Bilateral)  SURGEON:  Surgeons and Role:    Philip Aspen, DO - Primary    * Carrington Clamp, MD - Assisting  ANESTHESIA:   local and general  EBL:  5 mL   BLOOD ADMINISTERED:none  LOCAL MEDICATIONS USED:  MARCAINE    and Amount: 3 ml  SPECIMEN:  Source of Specimen:  fallopian tubes  DISPOSITION OF SPECIMEN:  PATHOLOGY  COUNTS:  YES  PLAN OF CARE: Discharge to home after PACU  PATIENT DISPOSITION:  PACU - hemodynamically stable.   Delay start of Pharmacological VTE agent (>24hrs) due to surgical blood loss or risk of bleeding: not applicable  FINDINGS: normal appearing uterus, ovaries and tubes, no abnormalities noted on abdominal and pelvic survery  DESCRIPTIONS OF PROCEDURE: Patient was taken to the operating room where anesthesia was administered and found to be adequate.  Patient was prepped and draped in the normal sterile fashion in dorsal lithotomy position.  A foley catheter was placed and a speculum placed. The cervix was grasped with a single toothed tenaculum and an acorn uterine manipulator was advanced.  Attention was then turned to the abdomen. Marcaine was placed subcutaneously at the inferior umbilicus and a 5mm vertical incision was made.  A veress needle was introduced and once low pressure gas confirmed, the abdomen was insufflated.  A trocar was then used to enter the peritoneum under direct visualization. Additional marcaine was injected subcutaneously in the LLQ and RLQ, 5mm incisions were made and 5mm tocars were used to place these additional ports.  The left tube was grasped and a Ligasure used to serially cauterize and transect the length of the mesosalpinx up to the proximal attachment to the  uterus.  The tube was then transected at its origin.  The same procedure was done on the right side.  Ligasure was use to do some additional cautery on the left where very light bleeding was noted.  Excellent hemostasis was then confirmed.  All instruments were then removed from the abdomen.  4-0 vicryl was used to close each 5mm incision subcuticularly.  The acorn uterine manipulator and tenaculum were then removed.  No additional bleeding was noted.  Patient tolerated the procedure well.  All counts were correct and patient was taken to recovery in stable condition.

## 2022-09-24 NOTE — Discharge Instructions (Signed)
   No acetaminophen/Tylenol until after 4:41 pm today if needed.  No ibuprofen, Advil, Aleve, Motrin, ketorolac, meloxicam, naproxen, or other NSAIDS until after 7:08 pm today if needed.   Post Anesthesia Home Care Instructions  Activity: Get plenty of rest for the remainder of the day. A responsible individual must stay with you for 24 hours following the procedure.  For the next 24 hours, DO NOT: -Drive a car -Advertising copywriter -Drink alcoholic beverages -Take any medication unless instructed by your physician -Make any legal decisions or sign important papers.  Meals: Start with liquid foods such as gelatin or soup. Progress to regular foods as tolerated. Avoid greasy, spicy, heavy foods. If nausea and/or vomiting occur, drink only clear liquids until the nausea and/or vomiting subsides. Call your physician if vomiting continues.  Special Instructions/Symptoms: Your throat may feel dry or sore from the anesthesia or the breathing tube placed in your throat during surgery. If this causes discomfort, gargle with warm salt water. The discomfort should disappear within 24 hours.  If you had a scopolamine patch placed behind your ear for the management of post- operative nausea and/or vomiting:  1. The medication in the patch is effective for 72 hours, after which it should be removed.  Wrap patch in a tissue and discard in the trash. Wash hands thoroughly with soap and water. 2. You may remove the patch earlier than 72 hours if you experience unpleasant side effects which may include dry mouth, dizziness or visual disturbances. 3. Avoid touching the patch. Wash your hands with soap and water after contact with the patch.

## 2022-09-24 NOTE — Anesthesia Postprocedure Evaluation (Signed)
Anesthesia Post Note  Patient: Karen Jordan  Procedure(s) Performed: LAPAROSCOPIC BILATERAL SALPINGECTOMY (Bilateral: Abdomen)     Patient location during evaluation: PACU Anesthesia Type: General Level of consciousness: awake and alert Pain management: pain level controlled Vital Signs Assessment: post-procedure vital signs reviewed and stable Respiratory status: spontaneous breathing, nonlabored ventilation, respiratory function stable and patient connected to nasal cannula oxygen Cardiovascular status: blood pressure returned to baseline and stable Postop Assessment: no apparent nausea or vomiting Anesthetic complications: no   No notable events documented.  Last Vitals:  Vitals:   09/24/22 1331 09/24/22 1345  BP: 116/74 105/69  Pulse: 76 77  Resp: 11 13  Temp:    SpO2: 97% 95%    Last Pain:  Vitals:   09/24/22 1330  TempSrc:   PainSc: 7                  Collene Schlichter

## 2022-09-25 ENCOUNTER — Other Ambulatory Visit (HOSPITAL_COMMUNITY): Payer: Self-pay

## 2022-09-25 ENCOUNTER — Encounter (HOSPITAL_BASED_OUTPATIENT_CLINIC_OR_DEPARTMENT_OTHER): Payer: Self-pay | Admitting: Obstetrics and Gynecology

## 2022-09-25 DIAGNOSIS — F4312 Post-traumatic stress disorder, chronic: Secondary | ICD-10-CM | POA: Diagnosis not present

## 2022-09-25 DIAGNOSIS — F331 Major depressive disorder, recurrent, moderate: Secondary | ICD-10-CM | POA: Diagnosis not present

## 2022-09-25 DIAGNOSIS — F413 Other mixed anxiety disorders: Secondary | ICD-10-CM | POA: Diagnosis not present

## 2022-09-25 MED ORDER — PRAZOSIN HCL 1 MG PO CAPS
1.0000 mg | ORAL_CAPSULE | Freq: Every day | ORAL | 0 refills | Status: AC
  Filled 2022-10-21: qty 30, 30d supply, fill #0

## 2022-09-30 DIAGNOSIS — F4312 Post-traumatic stress disorder, chronic: Secondary | ICD-10-CM | POA: Diagnosis not present

## 2022-10-22 ENCOUNTER — Other Ambulatory Visit: Payer: Self-pay

## 2022-10-23 ENCOUNTER — Ambulatory Visit: Payer: 59 | Attending: Nurse Practitioner | Admitting: Nurse Practitioner

## 2022-10-23 ENCOUNTER — Other Ambulatory Visit (HOSPITAL_COMMUNITY): Payer: Self-pay

## 2022-10-23 VITALS — BP 126/87 | HR 101 | Ht 64.0 in | Wt 231.6 lb

## 2022-10-23 DIAGNOSIS — R238 Other skin changes: Secondary | ICD-10-CM

## 2022-10-23 DIAGNOSIS — E669 Obesity, unspecified: Secondary | ICD-10-CM | POA: Diagnosis not present

## 2022-10-23 DIAGNOSIS — F5104 Psychophysiologic insomnia: Secondary | ICD-10-CM

## 2022-10-23 DIAGNOSIS — Z23 Encounter for immunization: Secondary | ICD-10-CM | POA: Diagnosis not present

## 2022-10-23 DIAGNOSIS — F332 Major depressive disorder, recurrent severe without psychotic features: Secondary | ICD-10-CM

## 2022-10-23 DIAGNOSIS — F3342 Major depressive disorder, recurrent, in full remission: Secondary | ICD-10-CM

## 2022-10-23 MED ORDER — VALACYCLOVIR HCL 1 G PO TABS
2000.0000 mg | ORAL_TABLET | ORAL | 4 refills | Status: AC
  Filled 2022-10-23: qty 11, 5d supply, fill #0
  Filled 2023-03-08 – 2023-06-16 (×3): qty 11, 5d supply, fill #1

## 2022-10-23 MED ORDER — TRAZODONE HCL 50 MG PO TABS
50.0000 mg | ORAL_TABLET | Freq: Every day | ORAL | 1 refills | Status: DC
Start: 2022-10-23 — End: 2023-04-16
  Filled 2022-10-23 – 2023-03-08 (×2): qty 90, 90d supply, fill #0
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
## 2022-10-24 LAB — CMP14+EGFR
ALT: 16 IU/L (ref 0–32)
AST: 20 IU/L (ref 0–40)
Albumin: 4.4 g/dL (ref 3.9–4.9)
Alkaline Phosphatase: 60 IU/L (ref 44–121)
BUN/Creatinine Ratio: 13 (ref 9–23)
BUN: 12 mg/dL (ref 6–20)
Bilirubin Total: 0.4 mg/dL (ref 0.0–1.2)
CO2: 19 mmol/L — ABNORMAL LOW (ref 20–29)
Calcium: 9.3 mg/dL (ref 8.7–10.2)
Chloride: 100 mmol/L (ref 96–106)
Creatinine, Ser: 0.9 mg/dL (ref 0.57–1.00)
Globulin, Total: 2.6 g/dL (ref 1.5–4.5)
Glucose: 81 mg/dL (ref 70–99)
Potassium: 4 mmol/L (ref 3.5–5.2)
Sodium: 137 mmol/L (ref 134–144)
Total Protein: 7 g/dL (ref 6.0–8.5)
eGFR: 85 mL/min/{1.73_m2} (ref >59)

## 2022-10-24 LAB — LIPID PANEL
Chol/HDL Ratio: 3.2 ratio (ref 0.0–4.4)
Cholesterol, Total: 189 mg/dL (ref 100–199)
HDL: 60 mg/dL (ref >39)
LDL Chol Calc (NIH): 113 mg/dL — ABNORMAL HIGH (ref 0–99)
Triglycerides: 90 mg/dL (ref 0–149)
VLDL Cholesterol Cal: 16 mg/dL (ref 5–40)

## 2022-10-25 ENCOUNTER — Other Ambulatory Visit (HOSPITAL_COMMUNITY): Payer: Self-pay

## 2022-10-28 ENCOUNTER — Encounter: Payer: Self-pay | Admitting: Nurse Practitioner

## 2022-10-29 DIAGNOSIS — F4312 Post-traumatic stress disorder, chronic: Secondary | ICD-10-CM | POA: Diagnosis not present

## 2022-12-03 DIAGNOSIS — F4312 Post-traumatic stress disorder, chronic: Secondary | ICD-10-CM | POA: Diagnosis not present

## 2023-02-27 DIAGNOSIS — F4310 Post-traumatic stress disorder, unspecified: Secondary | ICD-10-CM | POA: Diagnosis not present

## 2023-03-10 ENCOUNTER — Other Ambulatory Visit (HOSPITAL_COMMUNITY): Payer: Self-pay

## 2023-03-10 ENCOUNTER — Other Ambulatory Visit: Payer: Self-pay

## 2023-03-11 ENCOUNTER — Other Ambulatory Visit (HOSPITAL_COMMUNITY): Payer: Self-pay

## 2023-03-11 DIAGNOSIS — F4312 Post-traumatic stress disorder, chronic: Secondary | ICD-10-CM | POA: Diagnosis not present

## 2023-03-11 DIAGNOSIS — F331 Major depressive disorder, recurrent, moderate: Secondary | ICD-10-CM | POA: Diagnosis not present

## 2023-03-11 DIAGNOSIS — F413 Other mixed anxiety disorders: Secondary | ICD-10-CM | POA: Diagnosis not present

## 2023-03-11 MED ORDER — PRAZOSIN HCL 1 MG PO CAPS
1.0000 mg | ORAL_CAPSULE | Freq: Every day | ORAL | 0 refills | Status: DC
  Filled 2023-03-11: qty 30, 30d supply, fill #0

## 2023-03-20 ENCOUNTER — Other Ambulatory Visit (HOSPITAL_COMMUNITY): Payer: Self-pay

## 2023-03-21 ENCOUNTER — Other Ambulatory Visit (HOSPITAL_COMMUNITY): Payer: Self-pay

## 2023-03-25 DIAGNOSIS — F4310 Post-traumatic stress disorder, unspecified: Secondary | ICD-10-CM | POA: Diagnosis not present

## 2023-03-26 ENCOUNTER — Other Ambulatory Visit (HOSPITAL_COMMUNITY): Payer: Self-pay

## 2023-03-26 ENCOUNTER — Other Ambulatory Visit: Payer: Self-pay | Admitting: Nurse Practitioner

## 2023-03-26 DIAGNOSIS — E6609 Other obesity due to excess calories: Secondary | ICD-10-CM

## 2023-03-26 DIAGNOSIS — F331 Major depressive disorder, recurrent, moderate: Secondary | ICD-10-CM | POA: Diagnosis not present

## 2023-03-26 DIAGNOSIS — F4312 Post-traumatic stress disorder, chronic: Secondary | ICD-10-CM | POA: Diagnosis not present

## 2023-03-26 DIAGNOSIS — F413 Other mixed anxiety disorders: Secondary | ICD-10-CM | POA: Diagnosis not present

## 2023-03-26 MED ORDER — TRAZODONE HCL 100 MG PO TABS
100.0000 mg | ORAL_TABLET | Freq: Every day | ORAL | 0 refills | Status: DC
  Filled 2023-03-26 – 2023-04-16 (×2): qty 90, 90d supply, fill #0

## 2023-03-26 MED ORDER — PRAZOSIN HCL 2 MG PO CAPS
2.0000 mg | ORAL_CAPSULE | Freq: Every day | ORAL | 0 refills | Status: DC
Start: 2023-03-26 — End: 2023-06-18
  Filled 2023-03-26 – 2023-04-16 (×2): qty 90, 90d supply, fill #0

## 2023-03-27 ENCOUNTER — Other Ambulatory Visit (HOSPITAL_COMMUNITY): Payer: Self-pay

## 2023-03-27 ENCOUNTER — Other Ambulatory Visit: Payer: Self-pay

## 2023-03-28 ENCOUNTER — Other Ambulatory Visit (HOSPITAL_COMMUNITY): Payer: Self-pay

## 2023-03-28 MED ORDER — PHENTERMINE HCL 37.5 MG PO TABS
37.5000 mg | ORAL_TABLET | Freq: Every morning | ORAL | 0 refills | Status: AC
Start: 2023-03-28 — End: ?
  Filled 2023-03-28: qty 90, 90d supply, fill #0
  Filled 2023-04-16: qty 30, 30d supply, fill #0
  Filled 2023-06-16 – 2023-07-30 (×2): qty 30, 30d supply, fill #1

## 2023-04-07 ENCOUNTER — Other Ambulatory Visit (HOSPITAL_COMMUNITY): Payer: Self-pay

## 2023-04-08 ENCOUNTER — Other Ambulatory Visit (HOSPITAL_COMMUNITY): Payer: Self-pay

## 2023-04-09 ENCOUNTER — Other Ambulatory Visit (HOSPITAL_COMMUNITY): Payer: Self-pay

## 2023-04-16 ENCOUNTER — Other Ambulatory Visit: Payer: Self-pay | Admitting: Nurse Practitioner

## 2023-04-16 ENCOUNTER — Encounter: Payer: Self-pay | Admitting: Nurse Practitioner

## 2023-04-16 ENCOUNTER — Other Ambulatory Visit (HOSPITAL_COMMUNITY): Payer: Self-pay

## 2023-04-16 DIAGNOSIS — L234 Allergic contact dermatitis due to dyes: Secondary | ICD-10-CM

## 2023-04-16 MED ORDER — TRIAMCINOLONE ACETONIDE 0.1 % EX CREA
1.0000 | TOPICAL_CREAM | Freq: Two times a day (BID) | CUTANEOUS | 1 refills | Status: AC
Start: 2023-04-16 — End: ?
  Filled 2023-04-16: qty 60, 30d supply, fill #0
  Filled 2023-06-16 – 2023-12-11 (×3): qty 60, 30d supply, fill #1

## 2023-04-18 IMAGING — US US OB < 14 WEEKS - US OB TV
1 series · 15 of 28 positions shown · non-contrast
Comparison: Ultrasound 07/12/2011

CLINICAL DATA: Unknown dates, viability

EXAM:
OBSTETRIC <14 WK US AND TRANSVAGINAL OB US
TECHNIQUE: Both transabdominal and transvaginal ultrasound examinations were
performed for complete evaluation of the gestation as well as the
maternal uterus, adnexal regions, and pelvic cul-de-sac.
Transvaginal technique was performed to assess early pregnancy.

[Series 1: us ob < 14 weeks - us ob tv · 15 of 66 slices shown]
[im 1/66]
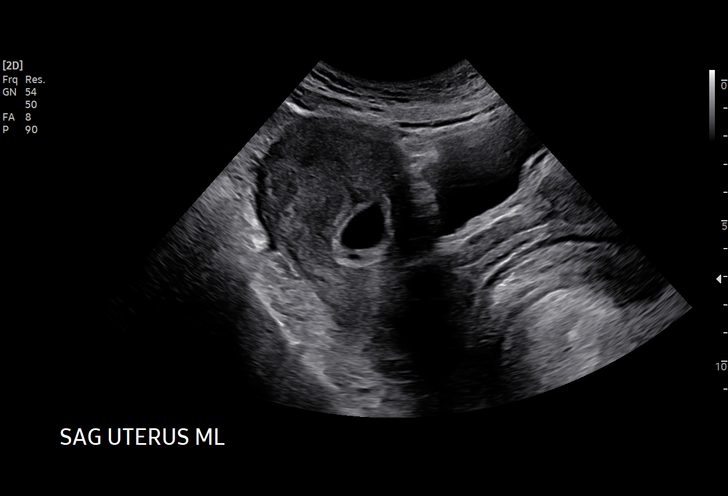
[im 5/66]
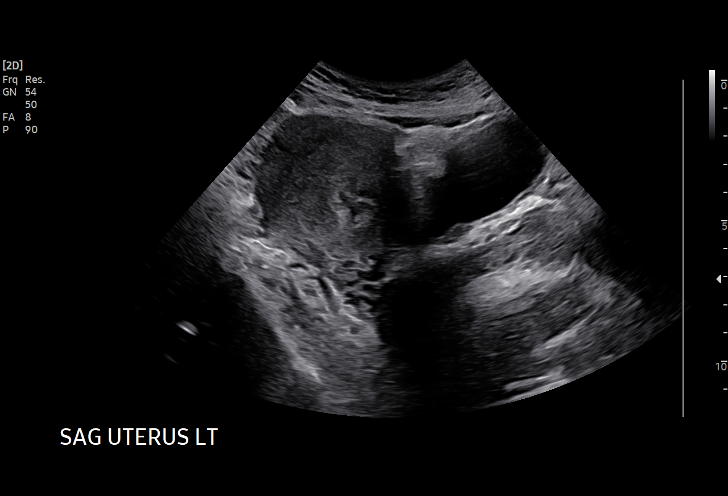
[im 10/66]
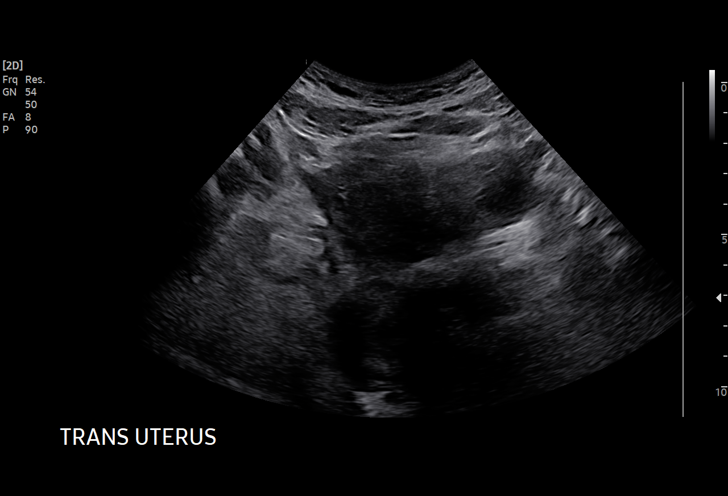
[im 15/66]
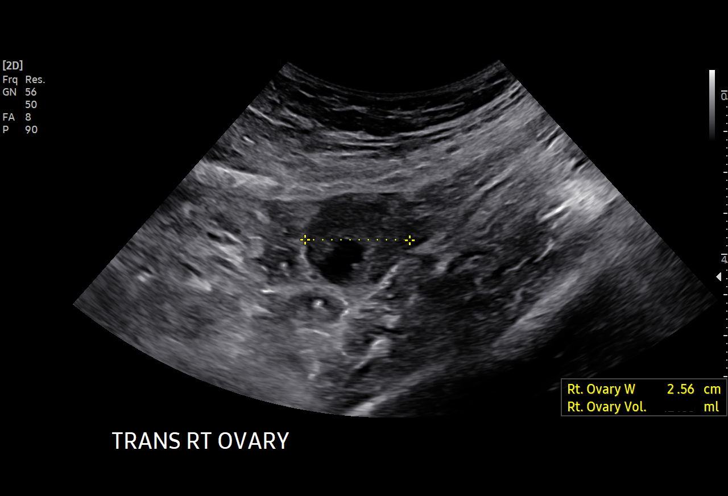
[im 20/66]
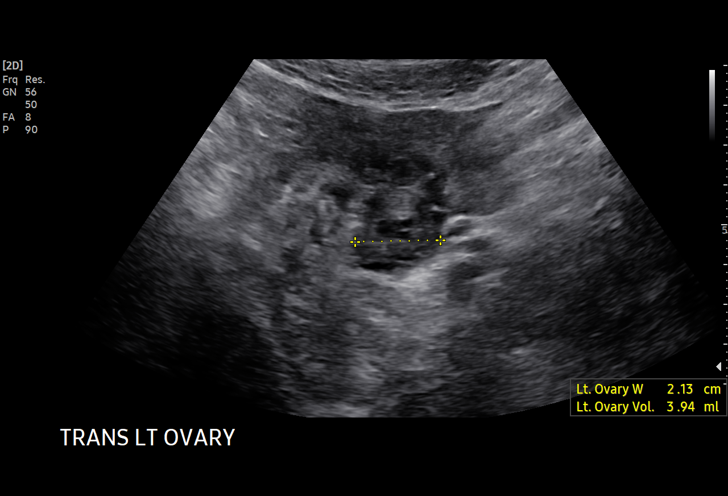
[im 25/66]
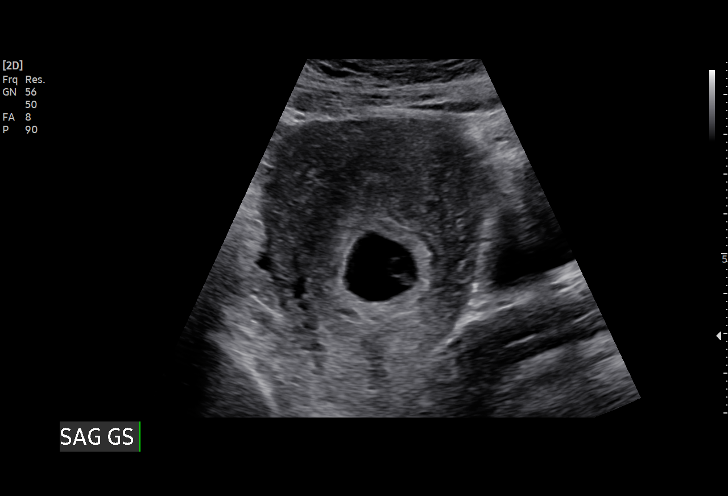
[im 29/66]
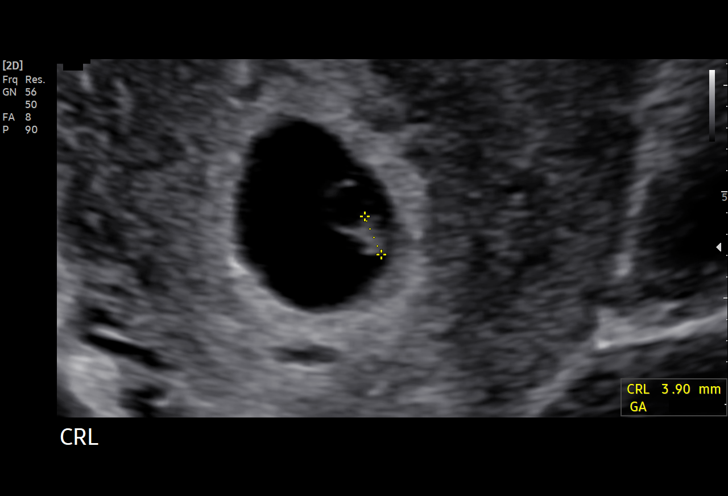
[im 34/66]
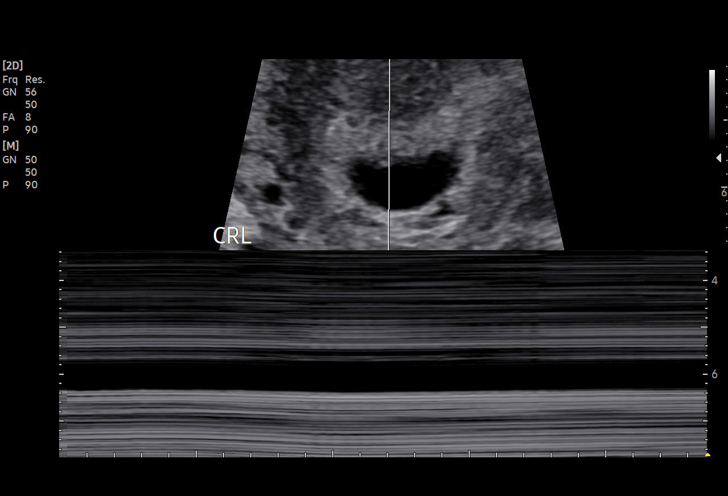
[im 37/66]
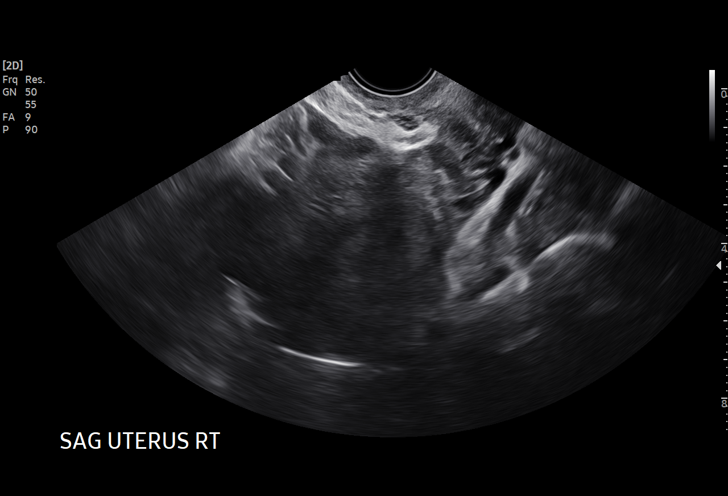
[im 41/66]
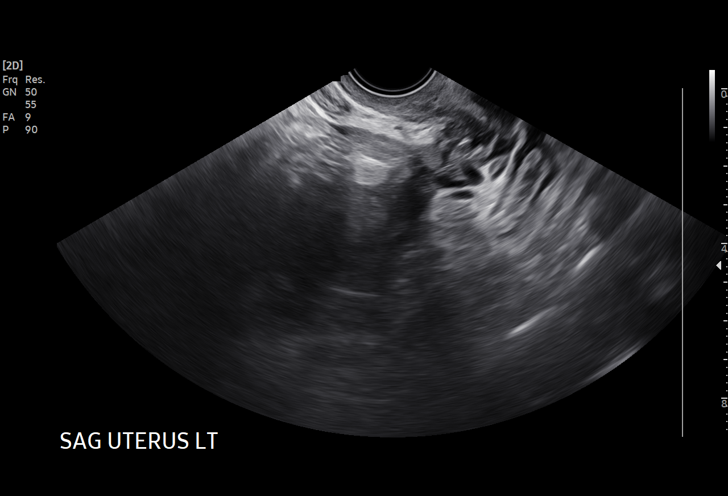
[im 46/66]
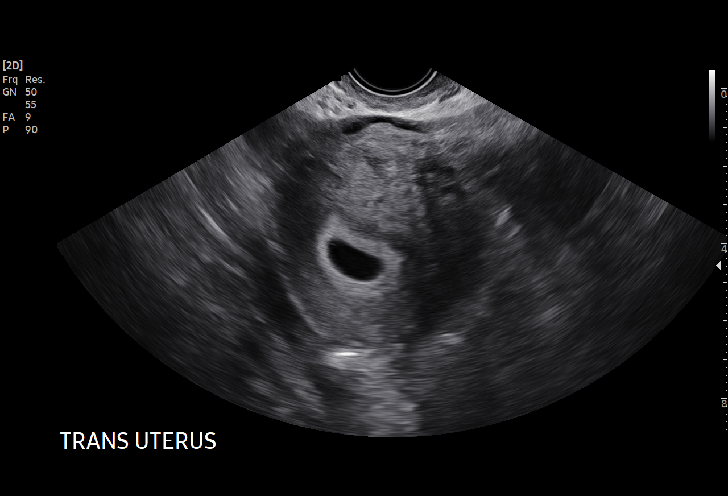
[im 51/66]
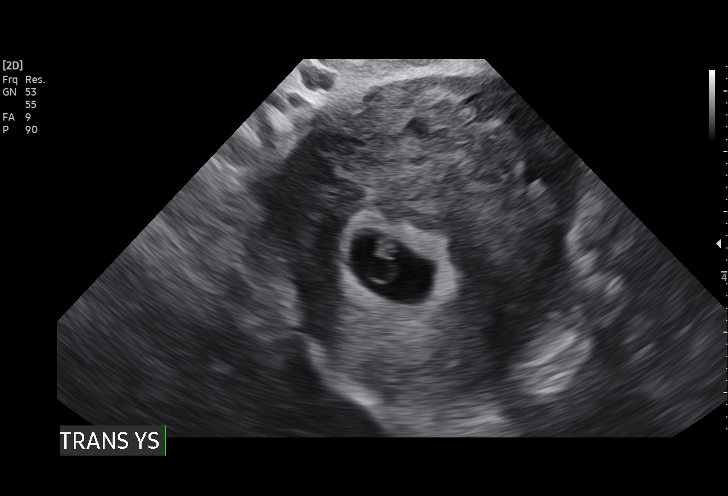
[im 56/66]
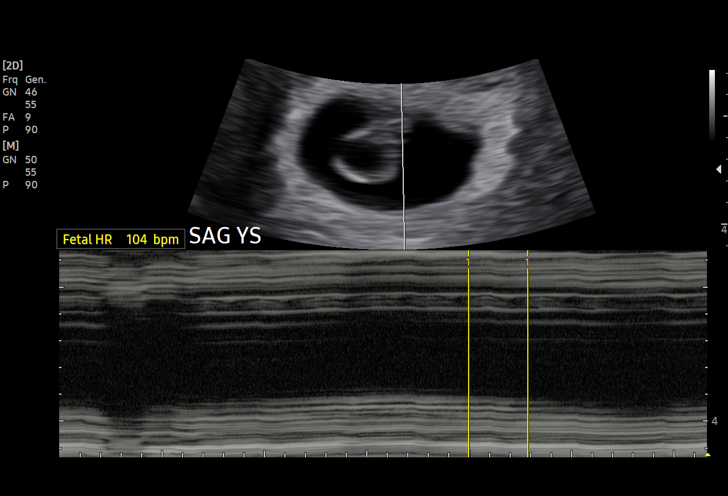
[im 61/66]
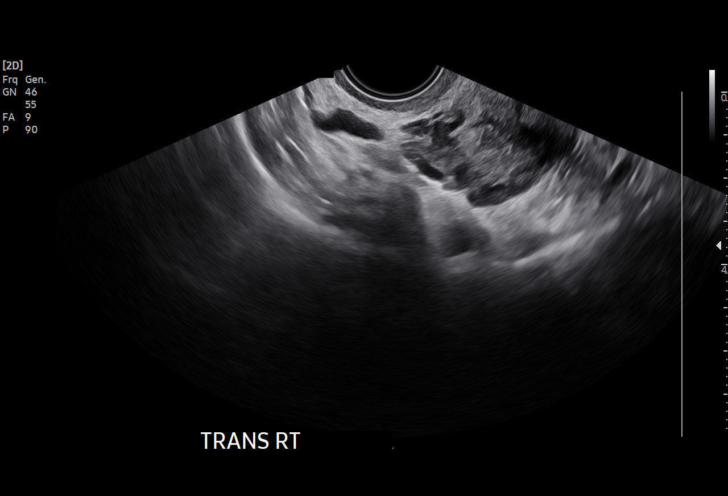
[im 66/66]
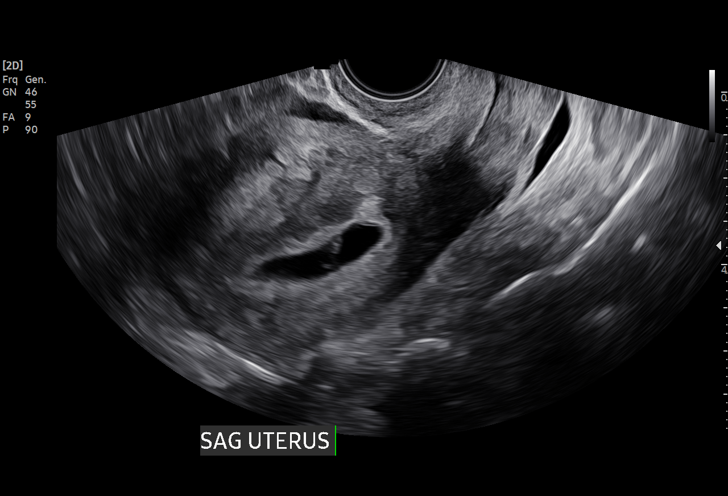

[15 of 28 positions shown; findings below may reference images not displayed]

FINDINGS: Intrauterine gestational sac: Single

Yolk sac:  Visualized.

Embryo:  Visualized.

Cardiac Activity: Visualized.

Heart Rate: 108 bpm

CRL:  4.4 mm mm   6 w   1 d                  US EDC: 03/05/2021

Subchorionic hemorrhage:  None visualized.

Maternal uterus/adnexae: Anteverted maternal uterus. Probable corpus
luteum in the right ovary. No concerning adnexal lesions. Trace
anechoic free fluid in the deep pelvis, nonspecific though often
physiologic.
IMPRESSION: Single intrauterine gestation at 6 weeks, 1 day by crown-rump length
sonographic estimation.

Trace anechoic free fluid in the deep pelvis.

## 2023-04-22 DIAGNOSIS — F4310 Post-traumatic stress disorder, unspecified: Secondary | ICD-10-CM | POA: Diagnosis not present

## 2023-05-09 IMAGING — US US OB TRANSVAGINAL
1 series · 15 of 28 positions shown · non-contrast
Comparison: None.
COMPARISON: None.

Addendum:
CLINICAL DATA: Vaginal bleeding

EXAM:
OBSTETRIC <14 WK US AND TRANSVAGINAL OB US
TECHNIQUE: Both transabdominal and transvaginal ultrasound examinations were
performed for complete evaluation of the gestation as well as the
maternal uterus, adnexal regions, and pelvic cul-de-sac.
Transvaginal technique was performed to assess early pregnancy.

[Series 1: us ob transvaginal · 43 acquisitions, 15 frames shown]
[im 1/43]
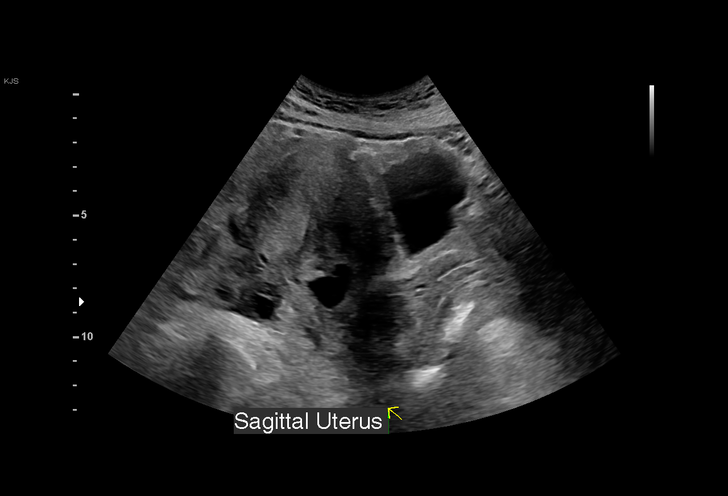
[im 4/43]
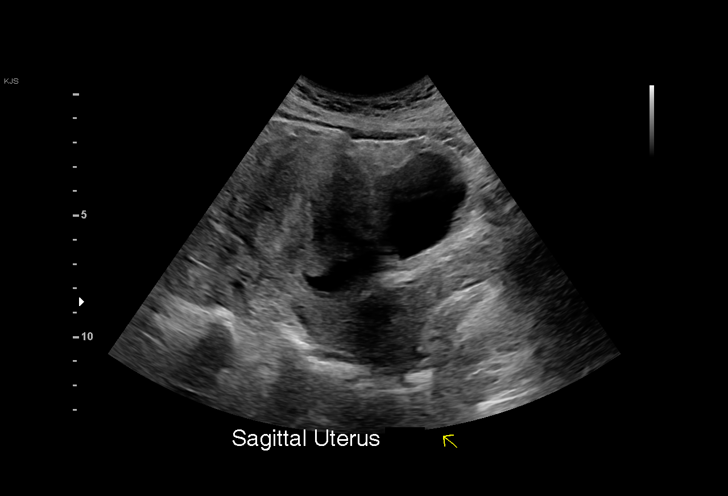
[im 7/43]
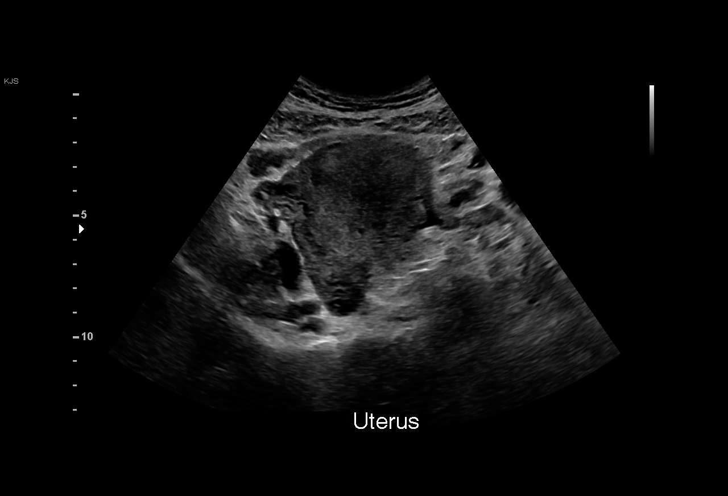
[im 10/43]
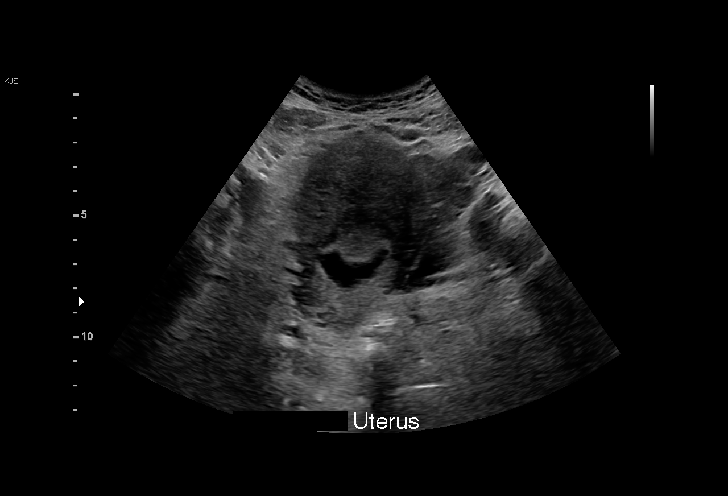
[im 13/43]
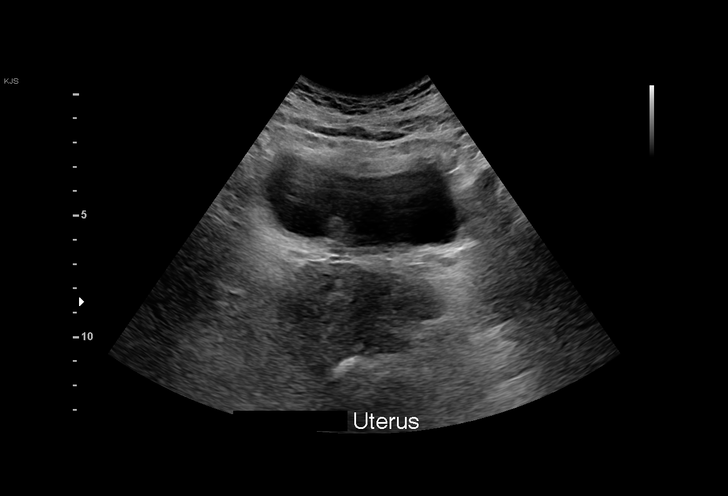
[im 16/43]
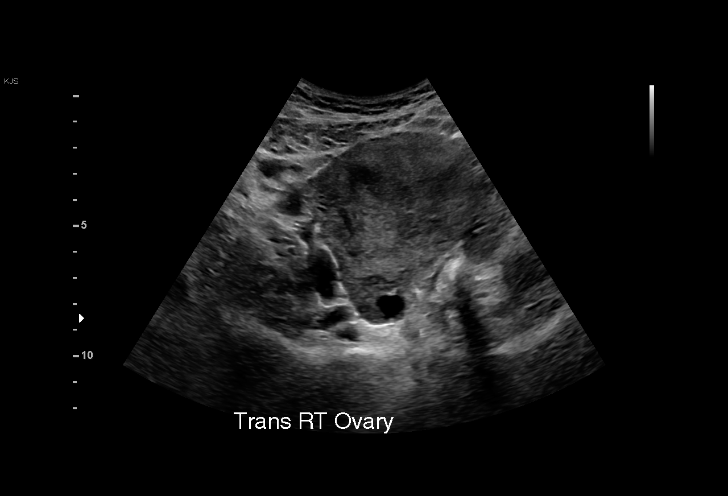
[im 19/43]
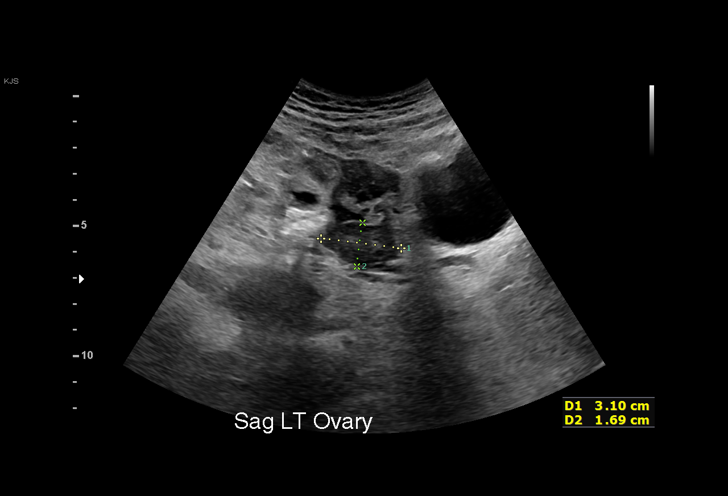
[im 22/43]
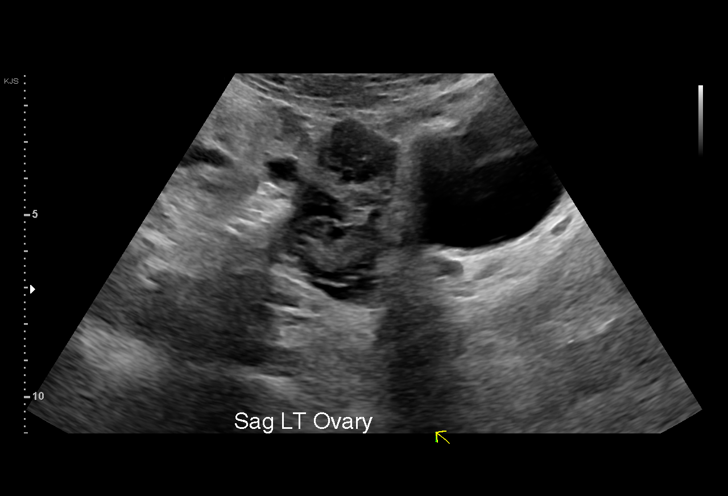
[im 24/43]
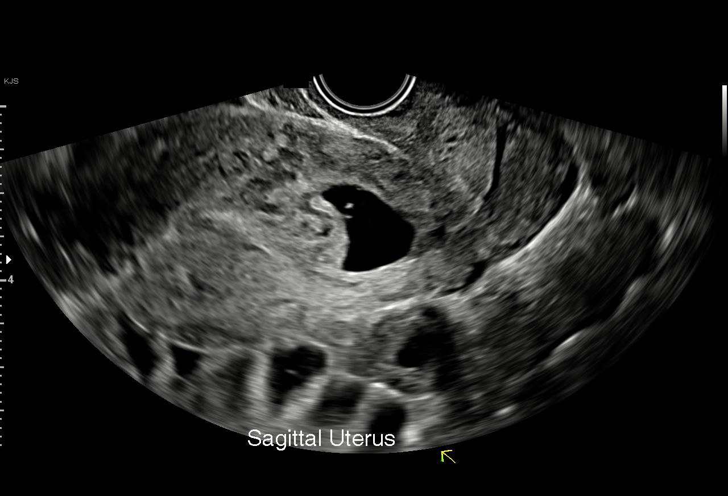
[im 27/43]
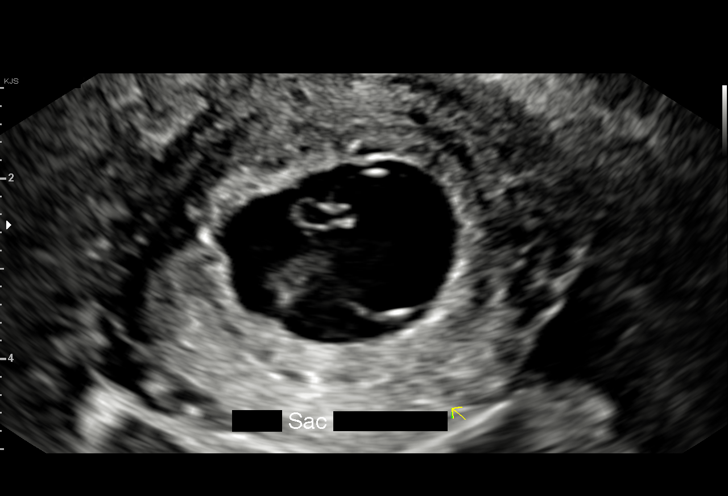
[im 30/43]
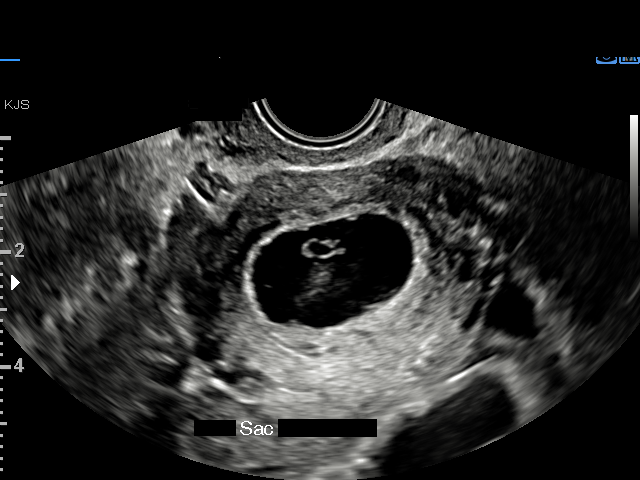
[im 33/43]
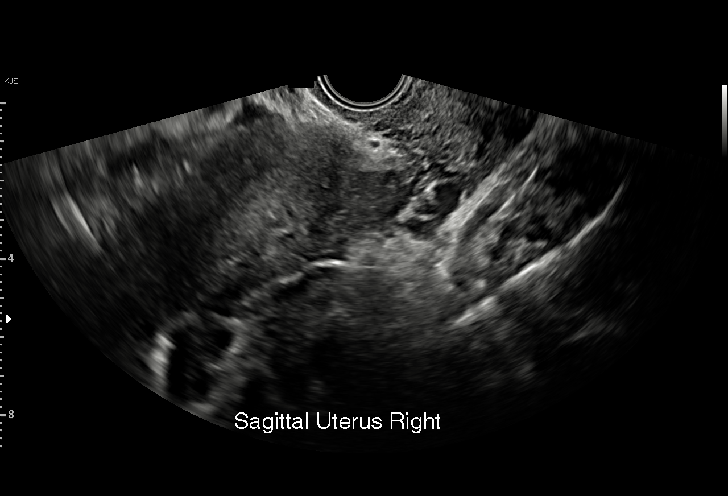
[im 36/43]
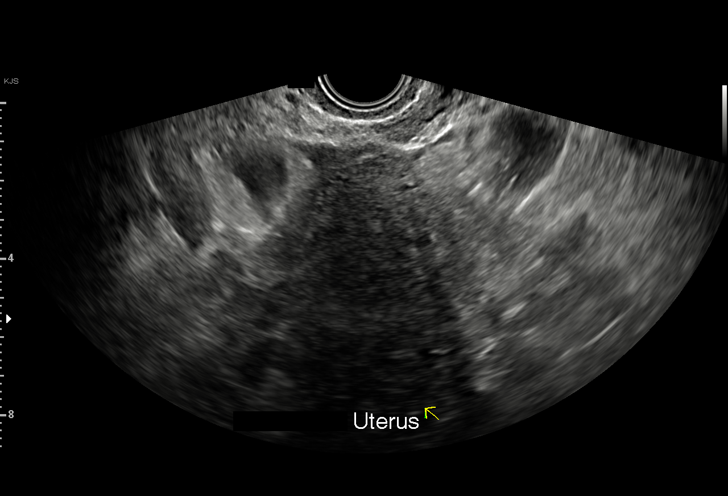
[im 39/43]
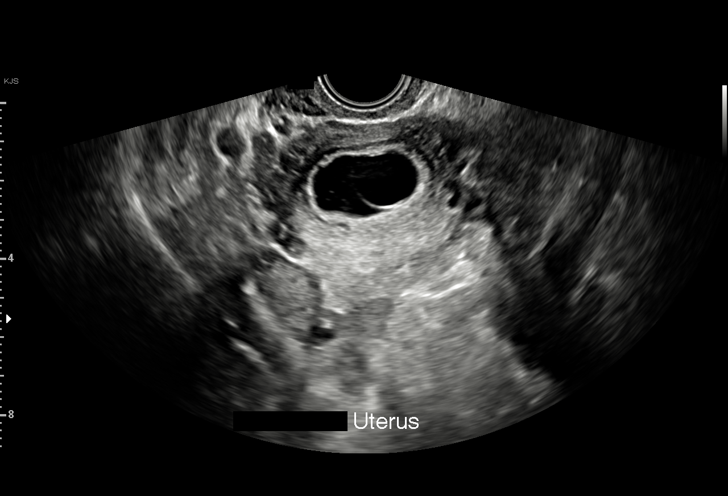
[im 43/43]
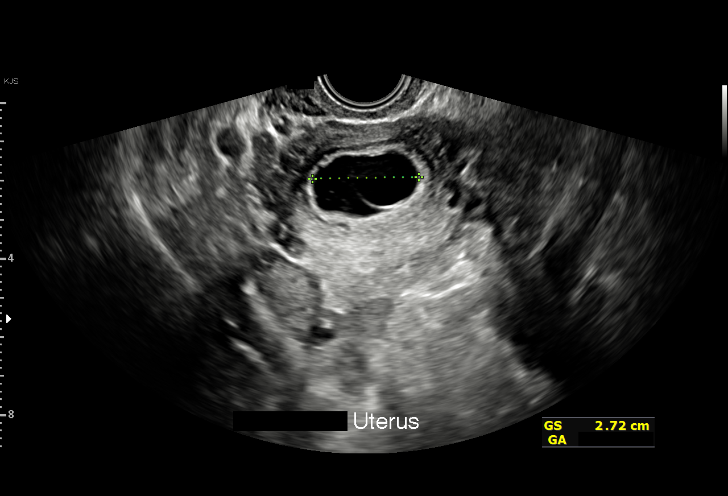

[15 of 28 positions shown; findings below may reference images not displayed]

FINDINGS: Intrauterine gestational sac: Single

Yolk sac:  Visualized.

Embryo:  Not Visualized.

Cardiac Activity: Not Visualized.

MSD: 21.2 mm   7 w   0 d

Subchorionic hemorrhage:  None visualized.

Maternal uterus/adnexae: Unremarkable. Right corpus luteum is noted.
There is trace free fluid.
IMPRESSION: Intrauterine gestational sac with yolk sac identified. However, no
embryo identified at this time. Follow-up ultrasound is recommended
in 10 days to confirm viability.

Trace free fluid.

ADDENDUM:
Prior comparison did not load at the time of dictation. There is an
ultrasound from 07/11/2020 showing intrauterine gestation with
embryo and cardiac activity. Therefore, findings today are
consistent with failed pregnancy.

These results were discussed by telephone on 08/01/2020 at [DATE] to
provider CHARAN BALLENGER

*** End of Addendum ***
FINDINGS: Intrauterine gestational sac: Single

Yolk sac:  Visualized.

Embryo:  Not Visualized.

Cardiac Activity: Not Visualized.

MSD: 21.2 mm   7 w   0 d

Subchorionic hemorrhage:  None visualized.

Maternal uterus/adnexae: Unremarkable. Right corpus luteum is noted.
There is trace free fluid.
IMPRESSION: Intrauterine gestational sac with yolk sac identified. However, no
embryo identified at this time. Follow-up ultrasound is recommended
in 10 days to confirm viability.

Trace free fluid.

## 2023-05-18 IMAGING — US US OB TRANSVAGINAL
1 series · 15 of 28 positions shown · non-contrast
Comparison: 08/01/2020

CLINICAL DATA: Incomplete miscarriage. Bleeding and cramping.
Quantitative beta HCG was 29,453 on 08/01/2020. Unknown LMP. By
first ultrasound patient would be 10 weeks 3 days.

EXAM:
TRANSVAGINAL OB ULTRASOUND
TECHNIQUE: Transvaginal ultrasound was performed for complete evaluation of the
gestation as well as the maternal uterus, adnexal regions, and
pelvic cul-de-sac.

[Series 1: us ob transvaginal · 15 of 50 slices shown]
[im 1/50]
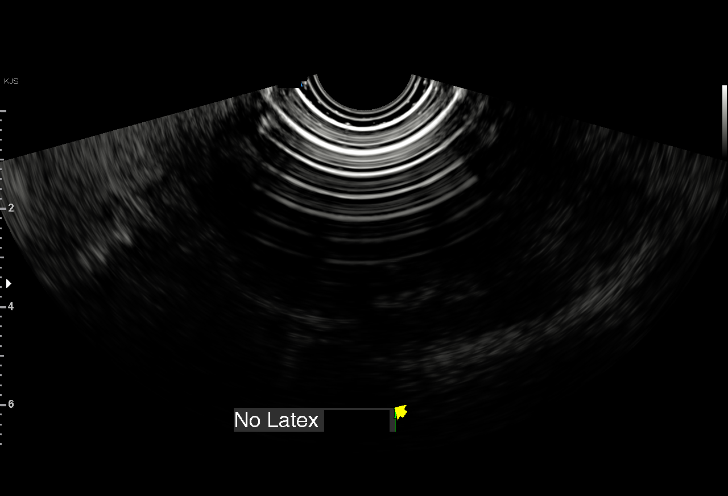
[im 4/50]
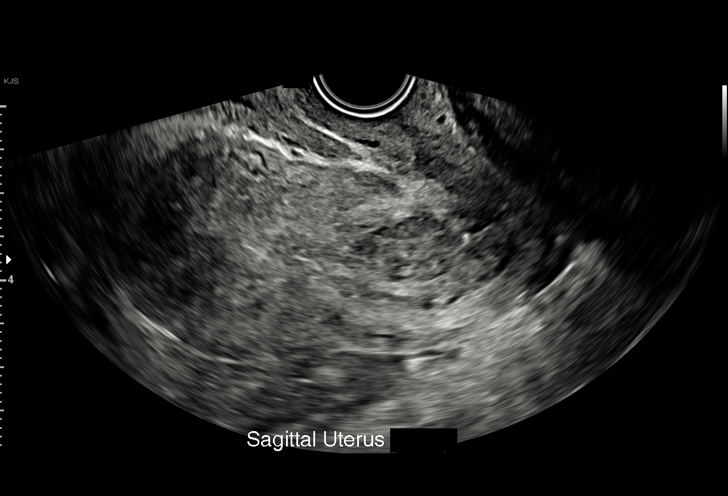
[im 8/50]
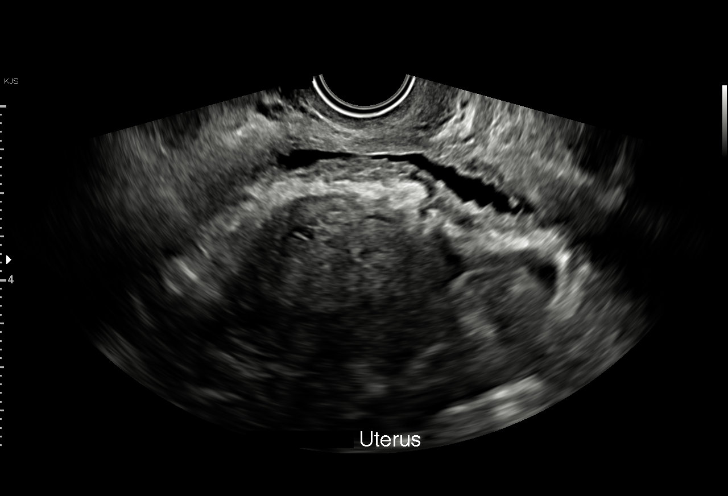
[im 11/50]
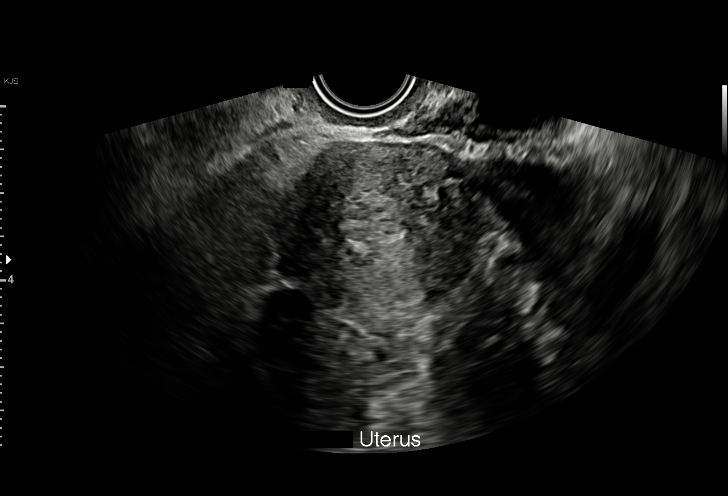
[im 15/50]
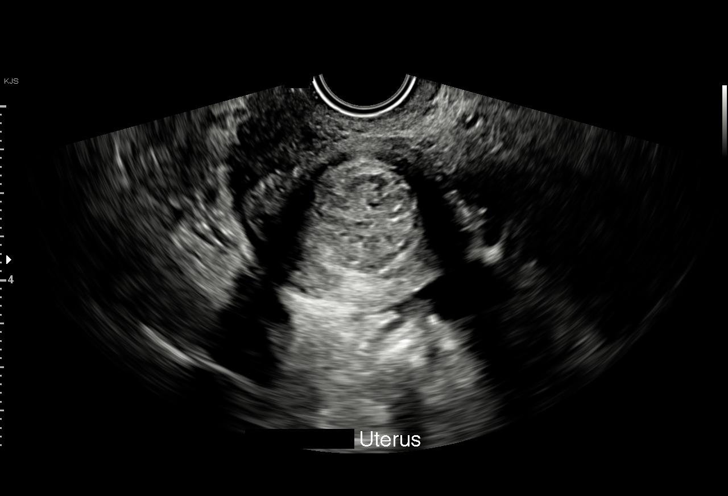
[im 19/50]
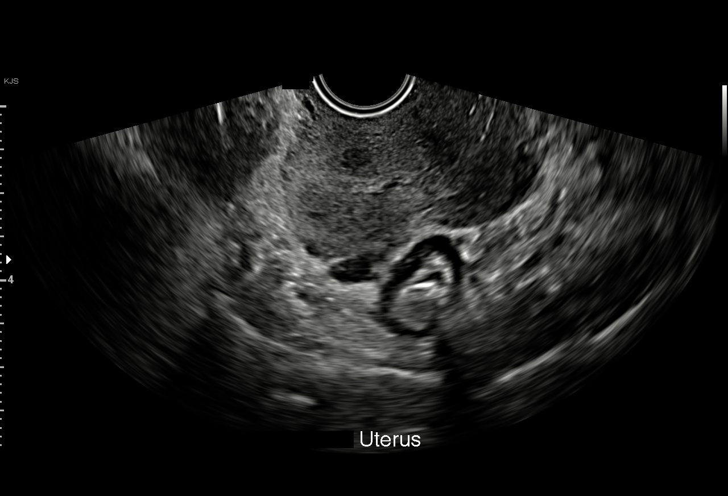
[im 22/50]
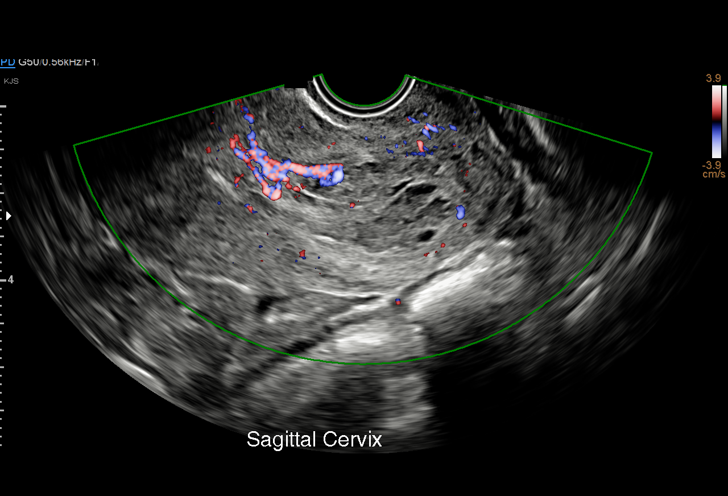
[im 26/50]
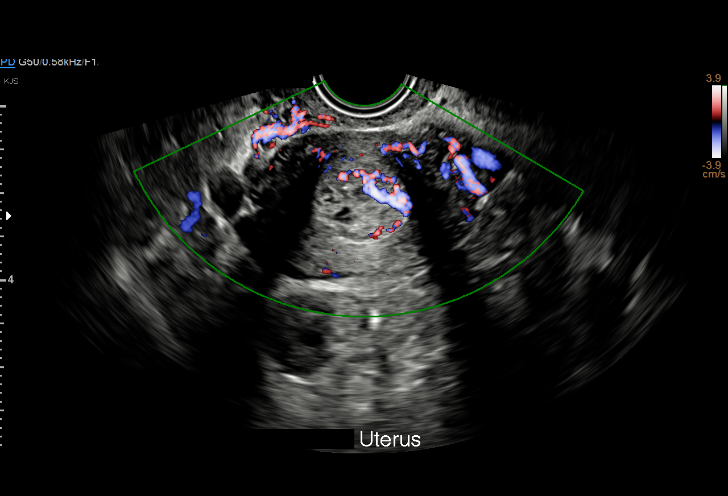
[im 28/50]
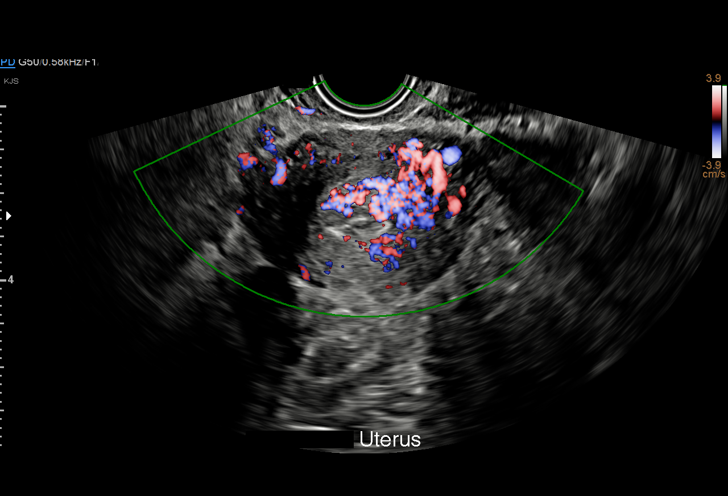
[im 31/50]
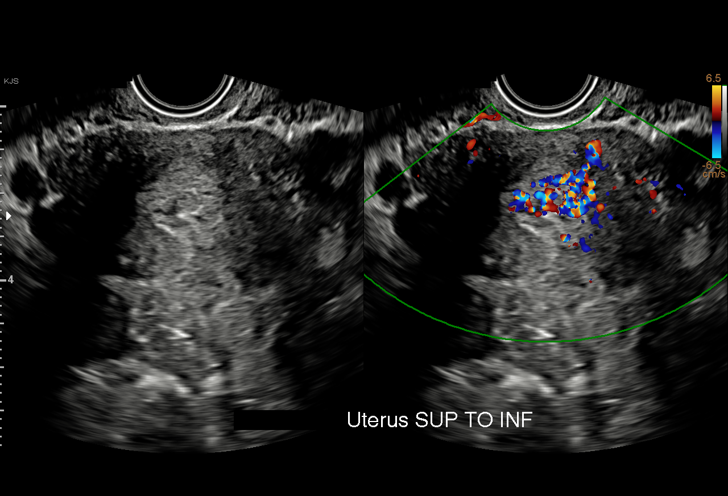
[im 35/50]
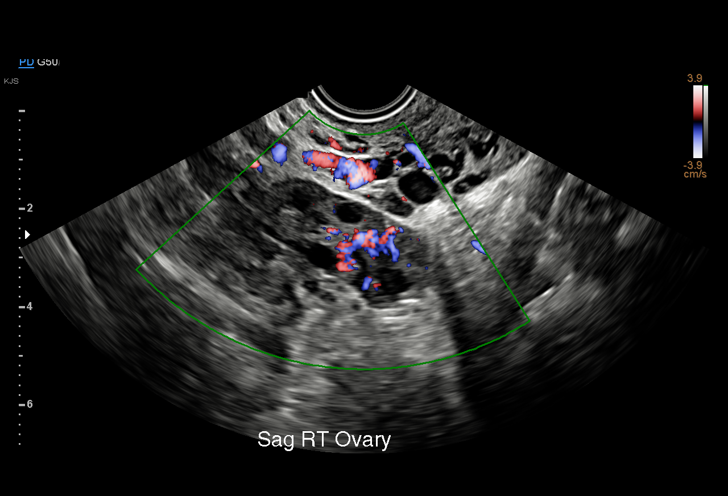
[im 39/50]
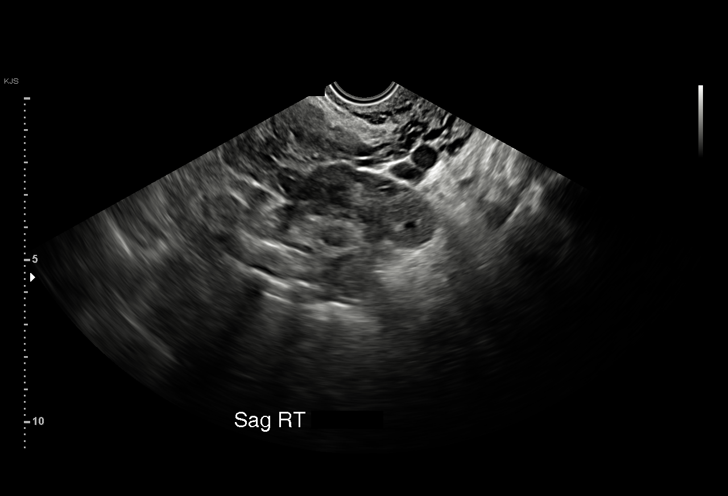
[im 42/50]
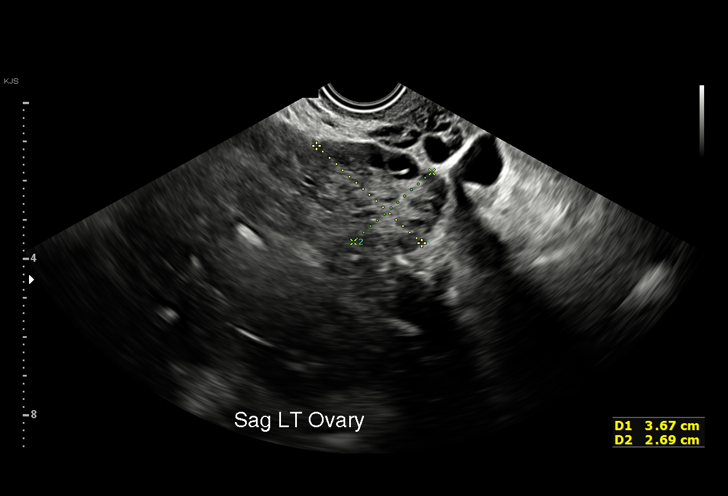
[im 46/50]
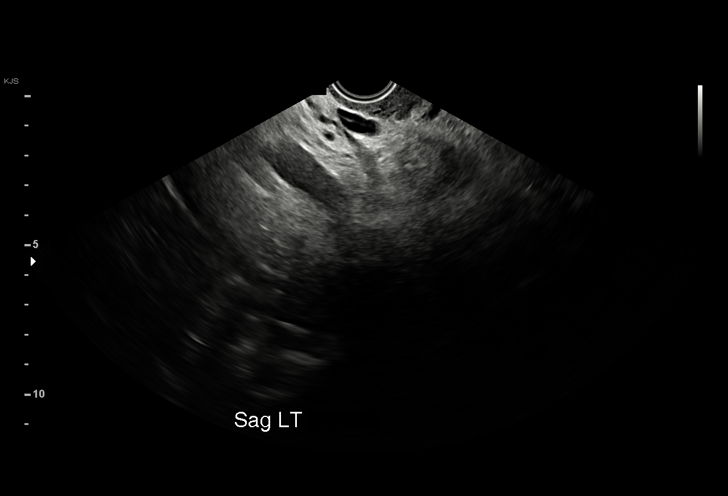
[im 50/50]
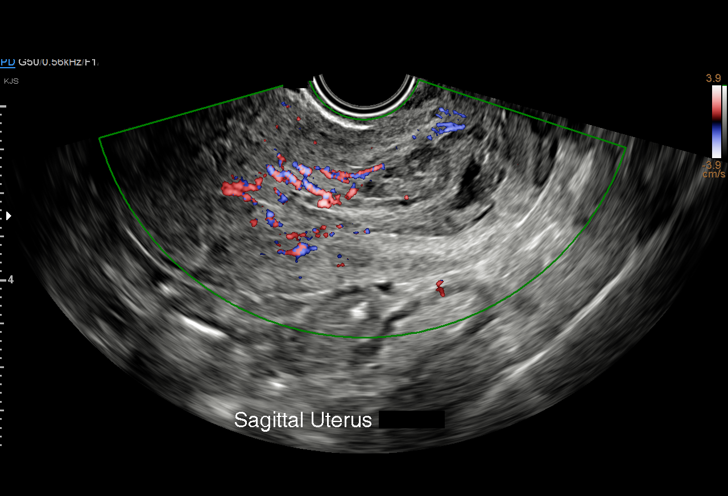

[15 of 28 positions shown; findings below may reference images not displayed]

FINDINGS: Intrauterine gestational sac: None

Yolk sac:  Not Visualized.

Embryo:  Not Visualized.

Cardiac Activity: Not Visualized.

Uterus: Heterogeneous endometrial contents in the LOWER uterine
segment measures 3.8 x 2.4 x 2.9 centimeters. Endometrial contents
are vascular on Doppler evaluation. No free pelvic fluid.

Maternal uterus/adnexae: RIGHT corpus luteum seen. LEFT ovary is
normal in appearance.
IMPRESSION: Findings are consistent with incomplete abortion. No normal
intrauterine or adnexal pregnancy identified. No free pelvic fluid.

## 2023-05-28 DIAGNOSIS — F4310 Post-traumatic stress disorder, unspecified: Secondary | ICD-10-CM | POA: Diagnosis not present

## 2023-06-16 ENCOUNTER — Other Ambulatory Visit: Payer: Self-pay

## 2023-06-18 ENCOUNTER — Other Ambulatory Visit (HOSPITAL_COMMUNITY): Payer: Self-pay

## 2023-06-18 DIAGNOSIS — F331 Major depressive disorder, recurrent, moderate: Secondary | ICD-10-CM | POA: Diagnosis not present

## 2023-06-18 DIAGNOSIS — F4312 Post-traumatic stress disorder, chronic: Secondary | ICD-10-CM | POA: Diagnosis not present

## 2023-06-18 DIAGNOSIS — F413 Other mixed anxiety disorders: Secondary | ICD-10-CM | POA: Diagnosis not present

## 2023-06-18 MED ORDER — PRAZOSIN HCL 2 MG PO CAPS
2.0000 mg | ORAL_CAPSULE | Freq: Every day | ORAL | 0 refills | Status: AC
  Filled 2023-07-30: qty 90, 90d supply, fill #0

## 2023-06-18 MED ORDER — TRAZODONE HCL 100 MG PO TABS
100.0000 mg | ORAL_TABLET | Freq: Every day | ORAL | 0 refills | Status: AC
  Filled 2023-07-30: qty 90, 90d supply, fill #0

## 2023-06-25 DIAGNOSIS — F4310 Post-traumatic stress disorder, unspecified: Secondary | ICD-10-CM | POA: Diagnosis not present

## 2023-06-26 ENCOUNTER — Other Ambulatory Visit (HOSPITAL_COMMUNITY): Payer: Self-pay

## 2023-07-23 DIAGNOSIS — H524 Presbyopia: Secondary | ICD-10-CM | POA: Diagnosis not present

## 2023-07-30 ENCOUNTER — Encounter: Payer: Self-pay | Admitting: Nurse Practitioner

## 2023-07-30 ENCOUNTER — Other Ambulatory Visit (HOSPITAL_COMMUNITY): Payer: Self-pay

## 2023-07-30 ENCOUNTER — Telehealth: Admitting: Physician Assistant

## 2023-07-30 ENCOUNTER — Other Ambulatory Visit: Payer: Self-pay

## 2023-07-30 DIAGNOSIS — R3989 Other symptoms and signs involving the genitourinary system: Secondary | ICD-10-CM

## 2023-07-30 MED ORDER — NITROFURANTOIN MONOHYD MACRO 100 MG PO CAPS
100.0000 mg | ORAL_CAPSULE | Freq: Two times a day (BID) | ORAL | 0 refills | Status: AC
Start: 2023-07-30 — End: ?
  Filled 2023-07-30 – 2023-10-21 (×4): qty 10, 5d supply, fill #0

## 2023-07-30 NOTE — Progress Notes (Signed)

## 2023-08-06 DIAGNOSIS — F4310 Post-traumatic stress disorder, unspecified: Secondary | ICD-10-CM | POA: Diagnosis not present

## 2023-08-11 ENCOUNTER — Other Ambulatory Visit (HOSPITAL_COMMUNITY): Payer: Self-pay

## 2023-08-26 ENCOUNTER — Other Ambulatory Visit (HOSPITAL_COMMUNITY): Payer: Self-pay

## 2023-08-27 DIAGNOSIS — F4310 Post-traumatic stress disorder, unspecified: Secondary | ICD-10-CM | POA: Diagnosis not present

## 2023-09-04 ENCOUNTER — Other Ambulatory Visit (HOSPITAL_COMMUNITY): Payer: Self-pay

## 2023-09-10 DIAGNOSIS — F4310 Post-traumatic stress disorder, unspecified: Secondary | ICD-10-CM | POA: Diagnosis not present

## 2023-09-11 ENCOUNTER — Telehealth: Admitting: Nurse Practitioner

## 2023-09-11 ENCOUNTER — Other Ambulatory Visit (HOSPITAL_COMMUNITY): Payer: Self-pay

## 2023-09-11 DIAGNOSIS — H65191 Other acute nonsuppurative otitis media, right ear: Secondary | ICD-10-CM

## 2023-09-11 MED ORDER — AZITHROMYCIN 250 MG PO TABS
ORAL_TABLET | ORAL | 0 refills | Status: AC
Start: 2023-09-11 — End: 2023-09-16
  Filled 2023-09-11: qty 6, 5d supply, fill #0

## 2023-09-11 MED ORDER — FLUTICASONE PROPIONATE 50 MCG/ACT NA SUSP
2.0000 | Freq: Every day | NASAL | 6 refills | Status: AC
Start: 2023-09-11 — End: ?
  Filled 2023-09-11: qty 16, 30d supply, fill #0

## 2023-09-11 NOTE — Progress Notes (Signed)
 E-Visit for Ear Pain - Acute Otitis Media   We are sorry that you are not feeling well. Here is how we plan to help!  Based on what you have shared with me it looks like you have Acute Otitis Media.  Acute Otitis Media is an infection of the middle or inner ear. This type of infection can cause redness, inflammation, and fluid buildup behind the tympanic membrane (ear drum).  The usual symptoms include: Earache/Pain Fever Upper respiratory symptoms Lack of energy/Fatigue/Malaise Slight hearing loss gradually worsening- if the inner ear fills with fluid What causes middle ear infections? Most middle ear infections occur when an infection such as a cold, leads to a build-up of mucus in the middle ear and causes the Eustachian tube (a thin tube that runs from the middle ear to the back of the nose) to become swollen or blocked.   This means mucus can't drain away properly, making it easier for an infection to spread into the middle ear.  How middle ear infections are treated: Most ear infections clear up within three to five days and don't need any specific treatment. If necessary, tylenol  or ibuprofen  should be used to relieve pain and a high temperature.  If you develop a fever higher than 102, or any significantly worsening symptoms, this could indicate a more serious infection moving to the middle/inner and needs face to face evaluation in an office by a provider.   Antibiotics aren't routinely used to treat middle ear infections, although they may occasionally be prescribed if symptoms persist or are particularly severe. Given your presentation,   I have prescribed Azithromycin  250 mg two tablets by mouth on day 1, then 1 tablet by mouth daily until completed  I will also call in a refill of Flonase  to assure you have enough.    Your symptoms should improve over the next 3 days and should resolve in about 7 days. Be sure to complete ALL of the prescription(s) given.  HOME CARE: Wash  your hands frequently. If you are prescribed an ear drop, do not place the tip of the bottle on your ear or touch it with your fingers. You can take Acetaminophen  650 mg every 4-6 hours as needed for pain.  If pain is severe or moderate, you can apply a heating pad (set on low) or hot water bottle (wrapped in a towel) to outer ear for 20 minutes.  This will also increase drainage.  GET HELP RIGHT AWAY IF: Fever is over 102.2 degrees. You develop progressive ear pain or hearing loss. Ear symptoms persist longer than 3 days after treatment.  MAKE SURE YOU: Understand these instructions. Will watch your condition. Will get help right away if you are not doing well or get worse.  Thank you for choosing an e-visit.  Your e-visit answers were reviewed by a board certified advanced clinical practitioner to complete your personal care plan. Depending upon the condition, your plan could have included both over the counter or prescription medications.  Please review your pharmacy choice. Make sure the pharmacy is open so you can pick up the prescription now. If there is a problem, you may contact your provider through [employer] and have the prescription routed to another pharmacy.  Your safety is important to us . If you have drug allergies check your prescription carefully.   For the next 24 hours you can use MyChart to ask questions about today's visit, request a non-urgent call back, or ask for a work or  school excuse. You will get an email with a survey after your eVisit asking about your experience. We would appreciate your feedback. I hope that your e-visit has been valuable and will aid in your recovery.  I spent approximately 5 minutes reviewing the patient's history, current symptoms and coordinating their care today.

## 2023-09-22 ENCOUNTER — Other Ambulatory Visit (HOSPITAL_COMMUNITY): Payer: Self-pay

## 2023-09-22 DIAGNOSIS — F413 Other mixed anxiety disorders: Secondary | ICD-10-CM | POA: Diagnosis not present

## 2023-09-22 DIAGNOSIS — F331 Major depressive disorder, recurrent, moderate: Secondary | ICD-10-CM | POA: Diagnosis not present

## 2023-09-22 DIAGNOSIS — F4312 Post-traumatic stress disorder, chronic: Secondary | ICD-10-CM | POA: Diagnosis not present

## 2023-09-22 MED ORDER — TRAZODONE HCL 100 MG PO TABS
150.0000 mg | ORAL_TABLET | Freq: Every day | ORAL | 0 refills | Status: AC
Start: 2023-09-22 — End: ?
  Filled 2023-09-22: qty 135, 90d supply, fill #0
  Filled 2023-10-07 – 2023-10-21 (×2): qty 45, 30d supply, fill #0
  Filled 2023-12-11: qty 45, 30d supply, fill #1

## 2023-09-22 MED ORDER — PRAZOSIN HCL 5 MG PO CAPS
5.0000 mg | ORAL_CAPSULE | Freq: Every day | ORAL | 0 refills | Status: AC
Start: 2023-09-22 — End: ?
  Filled 2023-09-22: qty 90, 90d supply, fill #0
  Filled 2023-10-07 – 2023-10-21 (×2): qty 30, 30d supply, fill #0
  Filled 2023-12-11: qty 30, 30d supply, fill #1

## 2023-09-29 DIAGNOSIS — F4310 Post-traumatic stress disorder, unspecified: Secondary | ICD-10-CM | POA: Diagnosis not present

## 2023-10-02 ENCOUNTER — Other Ambulatory Visit (HOSPITAL_COMMUNITY): Payer: Self-pay

## 2023-10-07 ENCOUNTER — Other Ambulatory Visit (HOSPITAL_COMMUNITY): Payer: Self-pay

## 2023-10-17 ENCOUNTER — Other Ambulatory Visit (HOSPITAL_COMMUNITY): Payer: Self-pay

## 2023-10-21 ENCOUNTER — Other Ambulatory Visit (HOSPITAL_COMMUNITY): Payer: Self-pay

## 2023-10-30 DIAGNOSIS — F4310 Post-traumatic stress disorder, unspecified: Secondary | ICD-10-CM | POA: Diagnosis not present

## 2023-11-05 DIAGNOSIS — Z01419 Encounter for gynecological examination (general) (routine) without abnormal findings: Secondary | ICD-10-CM | POA: Diagnosis not present

## 2023-11-05 DIAGNOSIS — Z124 Encounter for screening for malignant neoplasm of cervix: Secondary | ICD-10-CM | POA: Diagnosis not present

## 2023-12-11 ENCOUNTER — Other Ambulatory Visit (HOSPITAL_COMMUNITY): Payer: Self-pay

## 2023-12-12 ENCOUNTER — Other Ambulatory Visit: Payer: Self-pay

## 2023-12-15 ENCOUNTER — Other Ambulatory Visit (HOSPITAL_COMMUNITY): Payer: Self-pay

## 2023-12-18 ENCOUNTER — Other Ambulatory Visit (HOSPITAL_COMMUNITY): Payer: Self-pay

## 2023-12-24 ENCOUNTER — Other Ambulatory Visit (HOSPITAL_COMMUNITY): Payer: Self-pay

## 2023-12-30 DIAGNOSIS — F4310 Post-traumatic stress disorder, unspecified: Secondary | ICD-10-CM | POA: Diagnosis not present

## 2024-02-04 DIAGNOSIS — F4310 Post-traumatic stress disorder, unspecified: Secondary | ICD-10-CM | POA: Diagnosis not present
